# Patient Record
Sex: Male | Born: 1955 | Race: Asian | Hispanic: No | Marital: Married | Smoking: Never smoker
Health system: Southern US, Community
[De-identification: ages and names within clinical notes are randomized; demographics above are authoritative.]

## PROBLEM LIST (undated history)

## (undated) DIAGNOSIS — T7840XA Allergy, unspecified, initial encounter: Secondary | ICD-10-CM

## (undated) HISTORY — DX: Allergy, unspecified, initial encounter: T78.40XA

---

## 2015-09-02 ENCOUNTER — Ambulatory Visit (INDEPENDENT_AMBULATORY_CARE_PROVIDER_SITE_OTHER): Payer: BLUE CROSS/BLUE SHIELD | Admitting: Family Medicine

## 2015-09-02 ENCOUNTER — Ambulatory Visit (INDEPENDENT_AMBULATORY_CARE_PROVIDER_SITE_OTHER): Payer: BLUE CROSS/BLUE SHIELD

## 2015-09-02 VITALS — BP 114/72 | HR 69 | Temp 98.7°F | Resp 16 | Ht 65.0 in | Wt 129.4 lb

## 2015-09-02 DIAGNOSIS — Z114 Encounter for screening for human immunodeficiency virus [HIV]: Secondary | ICD-10-CM

## 2015-09-02 DIAGNOSIS — R351 Nocturia: Secondary | ICD-10-CM | POA: Diagnosis not present

## 2015-09-02 DIAGNOSIS — Z1159 Encounter for screening for other viral diseases: Secondary | ICD-10-CM

## 2015-09-02 DIAGNOSIS — Z13220 Encounter for screening for lipoid disorders: Secondary | ICD-10-CM | POA: Diagnosis not present

## 2015-09-02 DIAGNOSIS — Z131 Encounter for screening for diabetes mellitus: Secondary | ICD-10-CM

## 2015-09-02 DIAGNOSIS — R0602 Shortness of breath: Secondary | ICD-10-CM

## 2015-09-02 DIAGNOSIS — Z0000 Encounter for general adult medical examination without abnormal findings: Secondary | ICD-10-CM

## 2015-09-02 DIAGNOSIS — K219 Gastro-esophageal reflux disease without esophagitis: Secondary | ICD-10-CM

## 2015-09-02 DIAGNOSIS — N400 Enlarged prostate without lower urinary tract symptoms: Secondary | ICD-10-CM

## 2015-09-02 LAB — POC MICROSCOPIC URINALYSIS (UMFC): Mucus: ABSENT

## 2015-09-02 LAB — COMPREHENSIVE METABOLIC PANEL
ALT: 25 U/L (ref 9–46)
AST: 22 U/L (ref 10–35)
Albumin: 4.7 g/dL (ref 3.6–5.1)
Alkaline Phosphatase: 79 U/L (ref 40–115)
BILIRUBIN TOTAL: 0.7 mg/dL (ref 0.2–1.2)
BUN: 16 mg/dL (ref 7–25)
CO2: 26 mmol/L (ref 20–31)
CREATININE: 0.67 mg/dL — AB (ref 0.70–1.33)
Calcium: 9.6 mg/dL (ref 8.6–10.3)
Chloride: 104 mmol/L (ref 98–110)
GLUCOSE: 89 mg/dL (ref 65–99)
Potassium: 4.3 mmol/L (ref 3.5–5.3)
SODIUM: 139 mmol/L (ref 135–146)
Total Protein: 7.5 g/dL (ref 6.1–8.1)

## 2015-09-02 LAB — HEMOGLOBIN A1C
Hgb A1c MFr Bld: 5.4 % (ref <5.7)
Mean Plasma Glucose: 108 mg/dL

## 2015-09-02 LAB — POCT URINALYSIS DIP (MANUAL ENTRY)
BILIRUBIN UA: NEGATIVE
Bilirubin, UA: NEGATIVE
GLUCOSE UA: NEGATIVE
LEUKOCYTES UA: NEGATIVE
NITRITE UA: NEGATIVE
PH UA: 5.5
Protein Ur, POC: NEGATIVE
RBC UA: NEGATIVE
Spec Grav, UA: 1.025
Urobilinogen, UA: 0.2

## 2015-09-02 LAB — LIPID PANEL
CHOL/HDL RATIO: 3.3 ratio (ref <=5.0)
Cholesterol: 160 mg/dL (ref 125–200)
HDL: 49 mg/dL (ref >=40)
LDL Cholesterol: 96 mg/dL (ref <130)
Triglycerides: 75 mg/dL (ref <150)
VLDL: 15 mg/dL (ref <30)

## 2015-09-02 LAB — POCT CBC
GRANULOCYTE PERCENT: 64.7 % (ref 37–80)
HEMATOCRIT: 39.1 % — AB (ref 43.5–53.7)
HEMOGLOBIN: 13.8 g/dL — AB (ref 14.1–18.1)
LYMPH, POC: 2.4 (ref 0.6–3.4)
MCH, POC: 33.1 pg — AB (ref 27–31.2)
MCHC: 35.2 g/dL (ref 31.8–35.4)
MCV: 94 fL (ref 80–97)
MID (CBC): 0.4 (ref 0–0.9)
MPV: 7 fL (ref 0–99.8)
POC GRANULOCYTE: 5.1 (ref 2–6.9)
POC LYMPH PERCENT: 30.1 %L (ref 10–50)
POC MID %: 5.2 % (ref 0–12)
Platelet Count, POC: 258 10*3/uL (ref 142–424)
RBC: 4.16 M/uL — AB (ref 4.69–6.13)
RDW, POC: 13 %
WBC: 7.9 10*3/uL (ref 4.6–10.2)

## 2015-09-02 LAB — TSH: TSH: 2.09 m[IU]/L (ref 0.40–4.50)

## 2015-09-02 MED ORDER — OMEPRAZOLE 20 MG PO CPDR: 20.0000 mg | DELAYED_RELEASE_CAPSULE | Freq: Every day | ORAL | Status: DC

## 2015-09-02 NOTE — Patient Instructions (Addendum)
   IF you received an x-ray today, you will receive an invoice from [facility]. Please contact  Radiology at [phone] with questions or concerns regarding your invoice.   IF you received labwork today, you will receive an invoice from [facility]. Please contact [facility] at [phone] with questions or concerns regarding your invoice.   Our billing staff will not be able to assist you with questions regarding bills from these companies.  You will be contacted with the lab results as soon as they are available. The fastest way to get your results is to activate your My Chart account. Instructions are located on the last page of this paperwork. If you have not heard from us regarding the results in 2 weeks, please contact this office.    Keeping you healthy  Get these tests  Blood pressure- Have your blood pressure checked once a year by your healthcare provider.  Normal blood pressure is 120/80  Weight- Have your body mass index (BMI) calculated to screen for obesity.  BMI is a measure of body fat based on height and weight. You can also calculate your own BMI at www.nhl[facility].com/bmi/.  Cholesterol- Have your cholesterol checked every year.  Diabetes- Have your blood sugar checked regularly if you have high blood pressure, high cholesterol, have a family history of diabetes or if you are overweight.  Screening for Colon Cancer- Colonoscopy starting at age 50.  Screening may begin sooner depending on your family history and other health conditions. Follow up colonoscopy as directed by your Gastroenterologist.  Screening for Prostate Cancer- Both blood work (PSA) and a rectal exam help screen for Prostate Cancer.  Screening begins at age 40 with African-American men and at age 50 with Caucasian men.  Screening may begin sooner depending on your family history.  Take these medicines  Aspirin- One aspirin daily can help prevent Heart  disease and Stroke.  Flu shot- Every fall.  Tetanus- Every 10 years.  Zostavax- Once after the age of 60 to prevent Shingles.  Pneumonia shot- Once after the age of 65; if you are younger than 65, ask your healthcare provider if you need a Pneumonia shot.  Take these steps  Don't smoke- If you do smoke, talk to your doctor about quitting.  For tips on how to quit, go to www.smokefree.gov or call 1-800-QUIT-NOW.  Be physically active- Exercise 5 days a week for at least 30 minutes.  If you are not already physically active start slow and gradually work up to 30 minutes of moderate physical activity.  Examples of moderate activity include walking briskly, mowing the yard, dancing, swimming, bicycling, etc.  Eat a healthy diet- Eat a variety of healthy food such as fruits, vegetables, low fat milk, low fat cheese, yogurt, lean meant, poultry, fish, beans, tofu, etc. For more information go to www.thenutritionsource.org  Drink alcohol in moderation- Limit alcohol intake to less than two drinks a day. Never drink and drive.  Dentist- Brush and floss twice daily; visit your dentist twice a year.  Depression- Your emotional health is as important as your physical health. If you're feeling down, or losing interest in things you would normally enjoy please talk to your healthcare provider.  Eye exam- Visit your eye doctor every year.  Safe sex- If you may be exposed to a sexually transmitted infection, use a condom.  Seat belts- Seat belts can save your life; always wear one.  Smoke/Carbon Monoxide detectors- These detectors need to be installed on   the appropriate level of your home.  Replace batteries at least once a year.  Skin cancer- When out in the sun, cover up and use sunscreen 15 SPF or higher.  Violence- If anyone is threatening you, please tell your healthcare provider.  Living Will/ Health care power of attorney- Speak with your healthcare provider and family. 

## 2015-09-02 NOTE — Progress Notes (Signed)
Subjective:    Patient ID: Keith Lucas, male    DOB: 14-Apr-1955, 60 y.o.   MRN: [id number]  09/02/2015  Annual Exam   HPI This 60 y.o. male presents for Complete Physical Examination.  Last physical:  never Colonoscopy:  never TDAP:  never Influenza:  never Eye exam:  never Dental exam:  never  SOB: intermittent episodes of SOB/hard to breathe.  Onset for two years.  Not daily.  Cannot get a deep breath.  No cough.  Non-exertional.  Must yawn to help get a deep breath.  Knows he is going to catch a cold. Does not occur weekly; usually occurs once per month on average; usually will catch a cold with symptoms.  Review of Systems  Constitutional: Negative for fever, chills, diaphoresis, activity change, appetite change, fatigue and unexpected weight change.  HENT: Negative for congestion, dental problem, drooling, ear discharge, ear pain, facial swelling, hearing loss, mouth sores, nosebleeds, postnasal drip, rhinorrhea, sinus pressure, sneezing, sore throat, tinnitus, trouble swallowing and voice change.   Eyes: Negative for photophobia, pain, discharge, redness, itching and visual disturbance.  Respiratory: Positive for shortness of breath. Negative for apnea, cough, choking, chest tightness, wheezing and stridor.   Cardiovascular: Negative for chest pain, palpitations and leg swelling.  Gastrointestinal: Negative for nausea, vomiting, abdominal pain, diarrhea, constipation and blood in stool.  Endocrine: Negative for cold intolerance, heat intolerance, polydipsia, polyphagia and polyuria.  Genitourinary: Negative for dysuria, urgency, frequency, hematuria, flank pain, decreased urine volume, discharge, penile swelling, scrotal swelling, enuresis, difficulty urinating, genital sores, penile pain and testicular pain.  Musculoskeletal: Negative for myalgias, back pain, joint swelling, arthralgias, gait problem, neck pain and neck stiffness.  Skin: Negative for color change, pallor, rash and  wound.  Allergic/Immunologic: Negative for environmental allergies, food allergies and immunocompromised state.  Neurological: Negative for dizziness, tremors, seizures, syncope, facial asymmetry, speech difficulty, weakness, light-headedness, numbness and headaches.  Hematological: Negative for adenopathy. Does not bruise/bleed easily.  Psychiatric/Behavioral: Negative for suicidal ideas, hallucinations, behavioral problems, confusion, sleep disturbance, self-injury, dysphoric mood, decreased concentration and agitation. The patient is not nervous/anxious and is not hyperactive.     Past Medical History:  Diagnosis Date  . Allergy    History reviewed. No pertinent surgical history. No Known Allergies Current Outpatient Prescriptions  Medication Sig Dispense Refill  . loratadine-pseudoephedrine (CLARITIN-D 24-HOUR) 10-240 MG 24 hr tablet Take 1 tablet by mouth daily.    Marland Kitchen. omeprazole (PRILOSEC) 20 MG capsule Take 1 capsule (20 mg total) by mouth daily. 30 capsule 5   No current facility-administered medications for this visit.    Social History   Social History  . Marital status: Married    Spouse name: N/A  . Number of children: N/A  . Years of education: N/A   Occupational History  . Not on file.   Social History Main Topics  . Smoking status: Never Smoker  . Smokeless tobacco: Not on file  . Alcohol use Not on file  . Drug use: Unknown  . Sexual activity: Yes   Other Topics Concern  . Not on file   Social History Narrative   Marital status: married; moved to [place] from [place] in 2006.      Children: 1 child; no grandchildren      Employment: works at [employer]; Sports administratorrestaurant owner      Tobacco: none      Alcohol: none      Exercise: none      Seatbelt: 100%   History  reviewed. No pertinent family history.     Objective:    BP 114/72   Pulse 69   Temp 98.7 F (37.1 C) (Oral)   Resp 16   Ht  (1.651 m)   Wt 129 lb 6.4 oz (58.7 kg)   SpO2 99%   BMI  21.53 kg/m  Physical Exam  Constitutional: He is oriented to person, place, and time. He appears well-developed and well-nourished. No distress.  HENT:  Head: Normocephalic and atraumatic.  Right Ear: External ear normal.  Left Ear: External ear normal.  Nose: Nose normal.  Mouth/Throat: Oropharynx is clear and moist.  Eyes: Conjunctivae and EOM are normal. Pupils are equal, round, and reactive to light.  Neck: Normal range of motion. Neck supple. Carotid bruit is not present. No thyromegaly present.  Cardiovascular: Normal rate, regular rhythm, normal heart sounds and intact distal pulses.  Exam reveals no gallop and no friction rub.   No murmur heard. Pulmonary/Chest: Effort normal and breath sounds normal. He has no wheezes. He has no rales.  Abdominal: Soft. Bowel sounds are normal. He exhibits no distension and no mass. There is no tenderness. There is no rebound and no guarding. Hernia confirmed negative in the right inguinal area and confirmed negative in the left inguinal area.  Genitourinary: Testes normal and penis normal.  Musculoskeletal:       Right shoulder: Normal.       Left shoulder: Normal.       Cervical back: Normal.  Lymphadenopathy:    He has no cervical adenopathy.       Right: No inguinal adenopathy present.       Left: No inguinal adenopathy present.  Neurological: He is alert and oriented to person, place, and time. He has normal reflexes. No cranial nerve deficit. He exhibits normal muscle tone. Coordination normal.  Skin: Skin is warm and dry. No rash noted. He is not diaphoretic.  Psychiatric: He has a normal mood and affect. His behavior is normal. Judgment and thought content normal.        Assessment & Plan:   1. Routine physical examination   2. Screening for diabetes mellitus   3. Screening, lipid   4. Need for hepatitis C screening test   5. Screening for HIV (human immunodeficiency virus)   6. SOB (shortness of breath)   7. Gastroesophageal  reflux disease without esophagitis   8. Nocturia   9. BPH (benign prostatic hyperplasia)    -anticipatory guidance provided. -obtain age appropriate screening labs. -obtain PSA due to nocturia and BPH symptoms. -obtain CXR and EKG due to intermittent SOB. -rx for Prilosec provided due to frequent GERD symptoms.  GERD may be contributing to intermittent SOB symptoms.    Orders Placed This Encounter  Procedures  . DG Chest 2 View    Standing Status:   Future    Number of Occurrences:   1    Standing Expiration Date:   09/01/2016    Order Specific Question:   Reason for Exam (SYMPTOM  OR DIAGNOSIS REQUIRED)    Answer:   SOB at rest; GERD; from [place]    Order Specific Question:   Preferred imaging location?    Answer:   External  . Comprehensive metabolic panel    Order Specific Question:   Has the patient fasted?    Answer:   Yes  . Hemoglobin A1c  . Lipid panel    Order Specific Question:   Has the patient fasted?  Answer:   Yes  . TSH  . HIV antibody  . PSA  . Hepatitis C antibody  . POCT urinalysis dipstick  . POCT Microscopic Urinalysis ([facility])  . POCT CBC  . EKG 12-Lead   Meds ordered this encounter  Medications  . loratadine-pseudoephedrine (CLARITIN-D 24-HOUR) 10-240 MG 24 hr tablet    Sig: Take 1 tablet by mouth daily.  Marland Kitchen. omeprazole (PRILOSEC) 20 MG capsule    Sig: Take 1 capsule (20 mg total) by mouth daily.    Dispense:  30 capsule    Refill:  5    Return in about 2 months (around 11/03/2015) for recheck breathing, SOB, prostate.    Kristi Paulita FujitaMartin Smith, M.D. [facility]  [place] [address] [place], [place]  [id number] [phone] phone [phone]) [phone] fax

## 2015-09-03 LAB — HEPATITIS C ANTIBODY: HCV Ab: NEGATIVE

## 2015-09-03 LAB — HIV ANTIBODY (ROUTINE TESTING W REFLEX): HIV: NONREACTIVE

## 2015-09-03 LAB — PSA: PSA: 1.04 ng/mL (ref <=4.00)

## 2015-09-19 ENCOUNTER — Telehealth: Payer: Self-pay | Admitting: *Deleted

## 2015-09-19 NOTE — Telephone Encounter (Signed)
Patient needs results for labs Dr. Katrinka BlazingSmith has gone over and everything is normal.

## 2015-10-23 ENCOUNTER — Encounter: Payer: Self-pay | Admitting: Family Medicine

## 2017-05-29 IMAGING — DX DG CHEST 2V
2 series · 2 of 2 positions shown · non-contrast
Comparison: No prior.

CLINICAL DATA: Shortness of breath.

EXAM:
CHEST  2 VIEW

[chest pa]
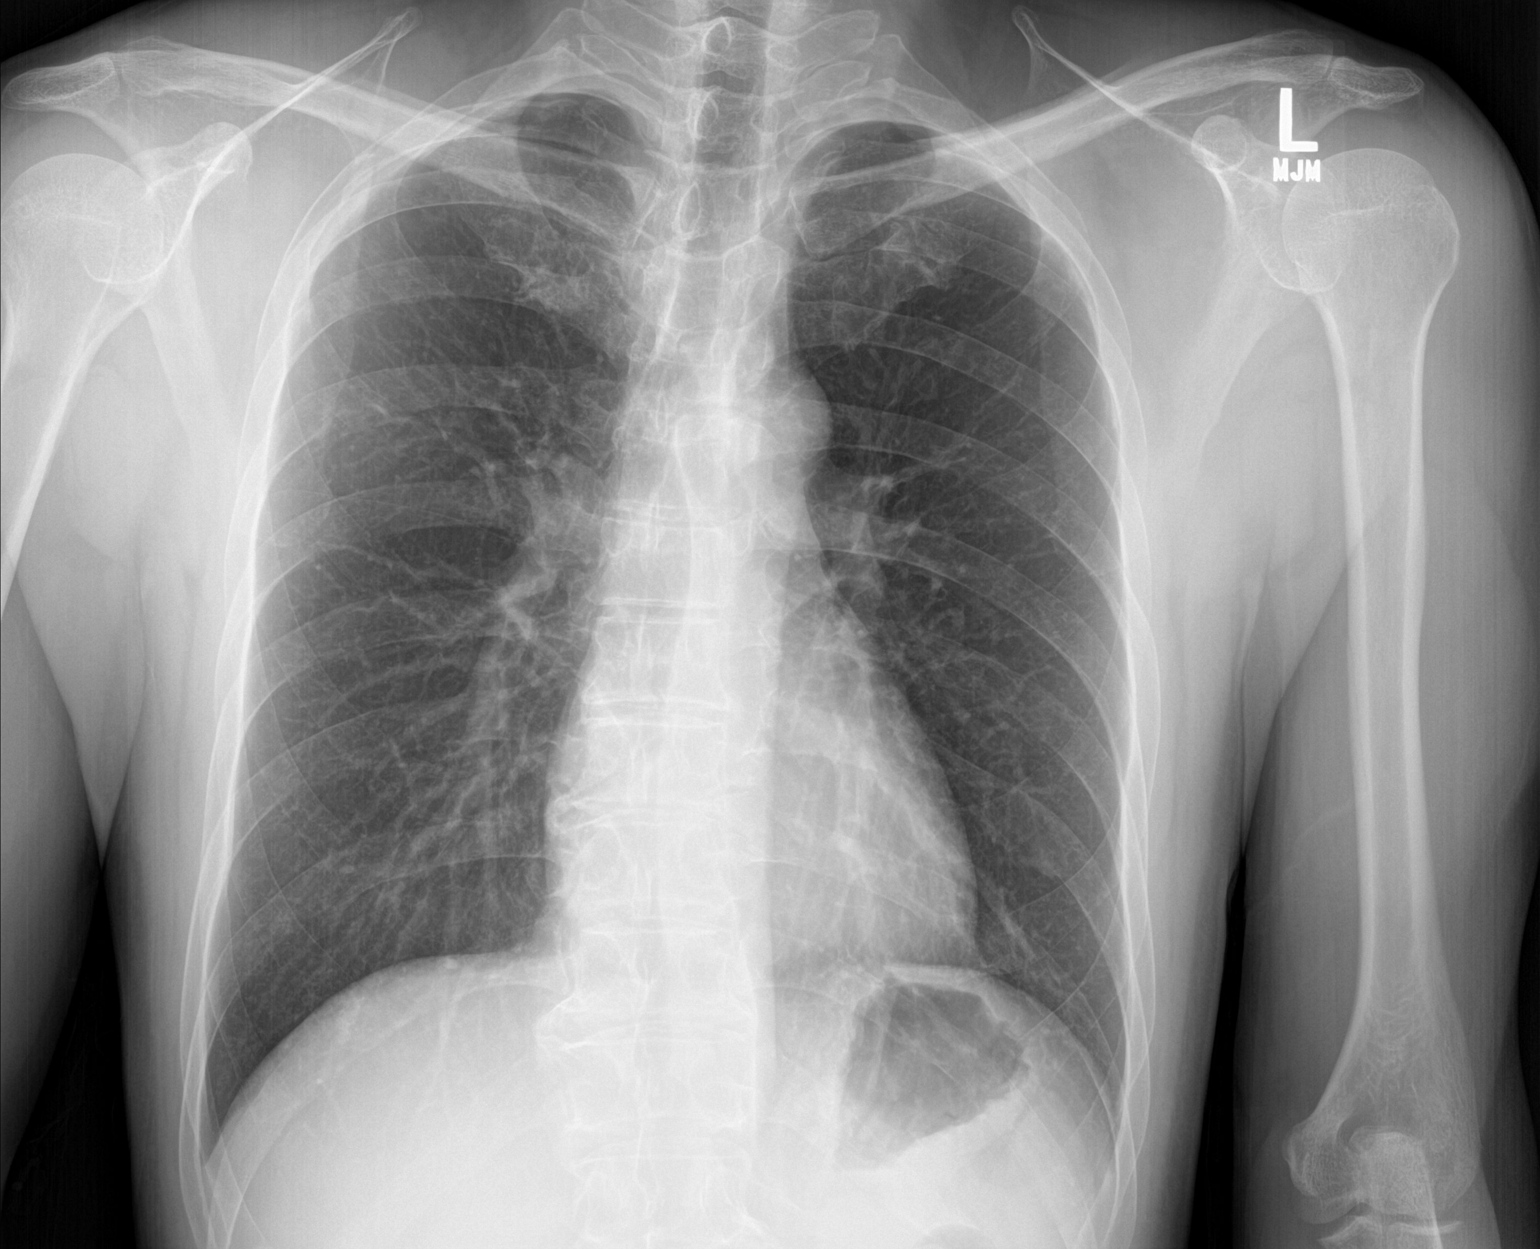

[chest lat]
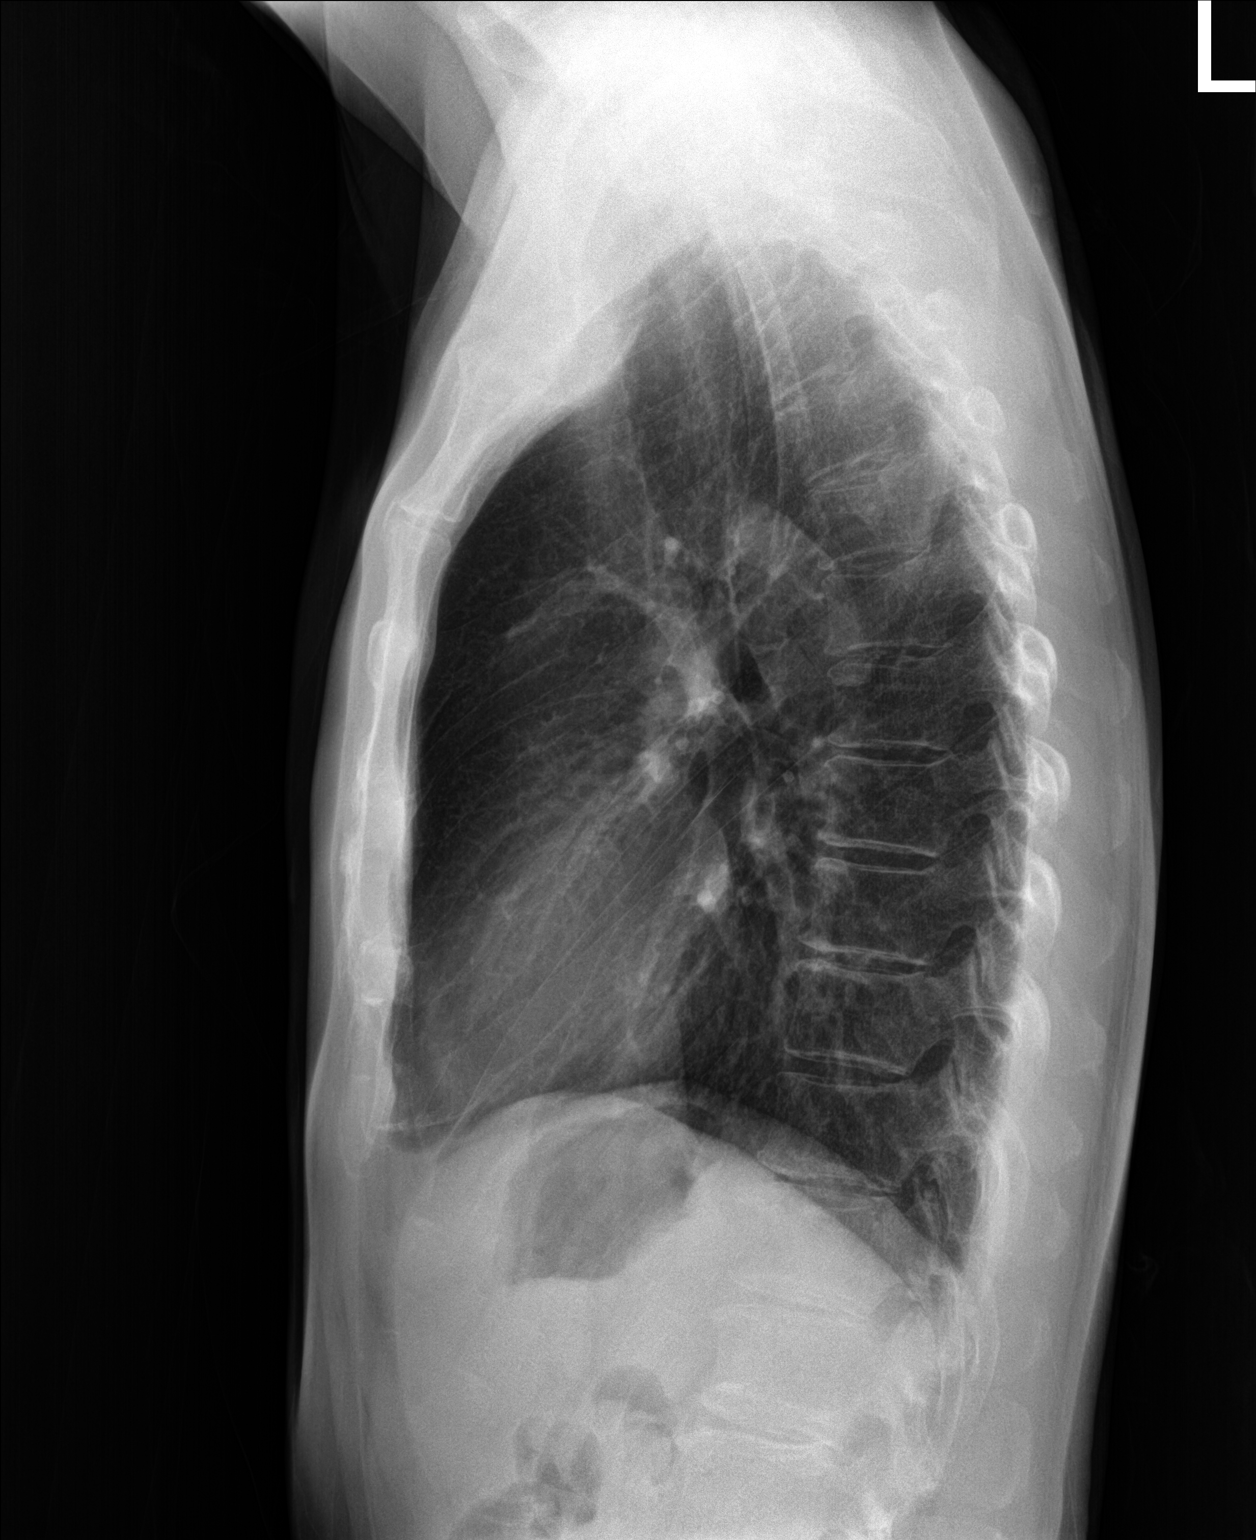

[2 of 2 positions shown; findings below may reference images not displayed]

FINDINGS: Mediastinum hilar structures normal. Lungs are clear. Heart size
normal. No pleural effusion pneumothorax. Degenerative changes
thoracic spine.
IMPRESSION: No acute abnormality.

## 2017-07-26 ENCOUNTER — Encounter: Payer: Self-pay | Admitting: Family Medicine

## 2019-05-11 ENCOUNTER — Emergency Department (HOSPITAL_COMMUNITY): Payer: 59

## 2019-05-11 ENCOUNTER — Other Ambulatory Visit: Payer: Self-pay

## 2019-05-11 ENCOUNTER — Encounter (HOSPITAL_COMMUNITY): Payer: Self-pay

## 2019-05-11 ENCOUNTER — Emergency Department (HOSPITAL_COMMUNITY)
Admission: EM | Admit: 2019-05-11 | Discharge: 2019-05-11 | Disposition: A | Discharge status: Home / Self Care | Payer: 59 | Attending: Emergency Medicine | Admitting: Emergency Medicine

## 2019-05-11 DIAGNOSIS — Z79899 Other long term (current) drug therapy: Secondary | ICD-10-CM | POA: Diagnosis not present

## 2019-05-11 DIAGNOSIS — R079 Chest pain, unspecified: Secondary | ICD-10-CM | POA: Insufficient documentation

## 2019-05-11 DIAGNOSIS — R0602 Shortness of breath: Secondary | ICD-10-CM | POA: Diagnosis present

## 2019-05-11 LAB — CBC
HCT: 41.9 % (ref 39.0–52.0)
Hemoglobin: 13.6 g/dL (ref 13.0–17.0)
MCH: 31.5 pg (ref 26.0–34.0)
MCHC: 32.5 g/dL (ref 30.0–36.0)
MCV: 97 fL (ref 80.0–100.0)
Platelets: 298 10*3/uL (ref 150–400)
RBC: 4.32 MIL/uL (ref 4.22–5.81)
RDW: 12.3 % (ref 11.5–15.5)
WBC: 7.5 10*3/uL (ref 4.0–10.5)
nRBC: 0 % (ref 0.0–0.2)

## 2019-05-11 LAB — BASIC METABOLIC PANEL
Anion gap: 9 (ref 5–15)
BUN: 12 mg/dL (ref 8–23)
CO2: 27 mmol/L (ref 22–32)
Calcium: 9 mg/dL (ref 8.9–10.3)
Chloride: 104 mmol/L (ref 98–111)
Creatinine, Ser: 0.68 mg/dL (ref 0.61–1.24)
GFR calc Af Amer: >60 mL/min (ref >60)
GFR calc non Af Amer: >60 mL/min (ref >60)
Glucose, Bld: 98 mg/dL (ref 70–99)
Potassium: 4 mmol/L (ref 3.5–5.1)
Sodium: 140 mmol/L (ref 135–145)

## 2019-05-11 LAB — TROPONIN I (HIGH SENSITIVITY)
Troponin I (High Sensitivity): 3 ng/L (ref <18)
Troponin I (High Sensitivity): <2 ng/L (ref <18)

## 2019-05-11 LAB — D-DIMER, QUANTITATIVE: D-Dimer, Quant: <0.27 ug/mL-FEU (ref 0.00–0.50)

## 2019-05-11 NOTE — ED Provider Notes (Signed)
Pt seen in conjunction with L Joldersma, PA-C.   In brief, pt presenting for increasing frequency of episodes of CP and SOB.  He is last for few seconds before resolving without intervention.  Not worse with exertion.  They are occurring daily.  No cardiac risk factors.  No PE risk factors.  Patient states he takes no medications daily, has no medical problems.  Denies tobacco use.  On exam, patient appears nontoxic.  No signs of respiratory distress.  Work-up overall reassuring.  Electrolytes stable.  Troponin normal.  Chest x-ray without abnormalities.  D-dimer pending.  Patient signed out to oncoming team for follow-up on dimer and plan for discharge.    Alveria Apley, PA-C 05/11/19 1521    Tegeler, Canary Brim, MD 05/11/19 [phone]

## 2019-05-11 NOTE — ED Notes (Signed)
Patient verbalizes understanding of discharge instructions. Opportunity for questioning and answers were provided. Armband removed by staff, pt discharged from ED.  

## 2019-05-11 NOTE — ED Provider Notes (Signed)
  Physical Exam  BP (!) 142/73   Pulse 72   Temp 97.9 F (36.6 C) (Oral)   Resp 18   Ht 5' 4.96" (1.65 m)   Wt 52 kg   SpO2 97%   BMI 19.10 kg/m   Physical Exam Vitals and nursing note reviewed.  Constitutional:      Appearance: He is well-developed.  HENT:     Head: Normocephalic and atraumatic.  Eyes:     Conjunctiva/sclera: Conjunctivae normal.  Cardiovascular:     Rate and Rhythm: Normal rate and regular rhythm.     Pulses: Normal pulses.  Pulmonary:     Effort: Pulmonary effort is normal. No respiratory distress.     Breath sounds: Normal breath sounds.  Abdominal:     Palpations: Abdomen is soft.     Tenderness: There is no abdominal tenderness.  Musculoskeletal:     Cervical back: Neck supple.  Skin:    General: Skin is warm and dry.  Neurological:     Mental Status: He is alert.     ED Course/Procedures     Procedures  MDM  Assumed care from offgoing provider at 1500. In brief, pt presenting to ED today due to SOB and dull L sided chest pain. CBC and CMP unremarkable. Serial trop 3 and <2, consecutively. EKG with NSR and no signs of acute ischemia. Ddimer collected by offgoing team. Plan at handoff is to f/u Ddimer. If negative, pt to be discharged home for outpt f/u with PCP.  Ddimer <0.27. No need for CTA. No need for further w/u or intervention while in the ED.   Pt discharged home. Discussed results with pt and he is in agreement with plan. Provided strict return precautions. Encouraged pt to f/u with PCP in 2-3 days for reassessment. Pt stable at time of discharge.   Pt assessed and evaluated with Dr. Adela Lank.  Delray Alt, MD       Delray Alt, MD 05/12/19 1478    Melene Plan, DO 05/12/19 2956

## 2019-05-11 NOTE — ED Provider Notes (Signed)
[facility] EMERGENCY DEPARTMENT Provider Note   CSN: [id number] Arrival date & time: 05/11/19  1109     History Chief Complaint  Patient presents with  . Shortness of Breath  . Chest Pain    Keith Lucas is a 64 y.o. male.  The history is provided by the patient. A language interpreter was used.   HPI Comments: Keith Lucas is a 64 y.o. male with no known medical history who presents to the Emergency Department complaining of shortness of breath for the past 2 weeks.  Patient states for the past 2 weeks he has been experiencing an intermittent feeling of shortness of breath which lasts for about 8 to 10 minutes and occurs multiple times throughout the day and are increasing in frequency.  He denies any known modifying factors.  He reports an associated dull ache in the left chest when his symptoms worsen.  He denies any current congestion or URI symptoms but when asked regarding his current medications he notes that he has been taking OTC decongestants.  He denies taking any other regular medications.  He feels as though "he is getting sick" but denies any recent illnesses.  He is not a smoker.  He denies any known medical history.  He denies fevers, chills, abdominal pain, nausea, vomiting, diarrhea, acute urinary changes, lightheadedness, syncope.     Past Medical History:  Diagnosis Date  . Allergy     There are no problems to display for this patient.   History reviewed. No pertinent surgical history.    No family history on file.  Social History   Tobacco Use  . Smoking status: Never Smoker  Substance Use Topics  . Alcohol use: Not on file  . Drug use: Not on file    Home Medications Prior to Admission medications   Medication Sig Start Date End Date Taking? Authorizing Provider  loratadine-pseudoephedrine (CLARITIN-D 24-HOUR) 10-240 MG 24 hr tablet Take 1 tablet by mouth daily.    [provider]  omeprazole (PRILOSEC) 20 MG capsule Take 1  capsule (20 mg total) by mouth daily. 09/02/15   Wardell Honour, MD    Allergies    Patient has no known allergies.  Review of Systems   Review of Systems  Constitutional: Positive for fatigue. Negative for chills and fever.  HENT: Negative for congestion, rhinorrhea and sneezing.   Respiratory: Positive for shortness of breath. Negative for wheezing.   Cardiovascular: Positive for chest pain. Negative for leg swelling.  Gastrointestinal: Negative for abdominal pain, diarrhea, nausea and vomiting.  Genitourinary: Negative for dysuria and hematuria.  All other systems reviewed and are negative.  Physical Exam Updated Vital Signs BP 112/74 (BP Location: Right Arm)   Pulse 76   Temp 97.9 F (36.6 C) (Oral)   Resp 15   Ht 5' 4.96" (1.65 m)   Wt 52 kg   SpO2 100%   BMI 19.10 kg/m   Physical Exam Vitals and nursing note reviewed.  Constitutional:      General: He is not in acute distress.    Appearance: He is well-developed and normal weight. He is not ill-appearing, toxic-appearing or diaphoretic.     Comments: Well-developed adult male.  He sits upright and speaks clearly and coherently.  HENT:     Head: Normocephalic and atraumatic.     Mouth/Throat:     Mouth: Mucous membranes are moist.     Pharynx: No pharyngeal swelling or oropharyngeal exudate.  Eyes:  Extraocular Movements: Extraocular movements intact.     Pupils: Pupils are equal, round, and reactive to light.  Chest:     Chest wall: Tenderness present. No deformity or crepitus.     Comments: Mild TTP noted on the left anterior chest wall.   Abdominal:     Palpations: Abdomen is soft. There is no mass.     Tenderness: There is no abdominal tenderness. There is no guarding or rebound.  Musculoskeletal:        General: Normal range of motion.     Cervical back: Normal range of motion and neck supple.     Right lower leg: No tenderness. No edema.     Left lower leg: No tenderness. No edema.  Neurological:      Mental Status: He is alert.    ED Results / Procedures / Treatments   Labs (all labs ordered are listed, but only abnormal results are displayed) Labs Reviewed  BASIC METABOLIC PANEL  CBC  TROPONIN I (HIGH SENSITIVITY)  TROPONIN I (HIGH SENSITIVITY)   EKG EKG Interpretation  Date/Time:  Thursday May 11 2019 11:14:25 EST Ventricular Rate:  80 PR Interval:  152 QRS Duration: 68 QT Interval:  386 QTC Calculation: 445 R Axis:   86 Text Interpretation: Normal sinus rhythm Normal ECG No prior ECG for comparison. No STEMI Confirmed by Theda Belfast ([id number]) on 05/11/2019 12:20:49 PM  Radiology DG Chest Portable 1 View  Result Date: 05/11/2019 CLINICAL DATA:  Chest pain today, chest tightness, shortness of breath. EXAM: PORTABLE CHEST 1 VIEW COMPARISON:  Chest x-ray dated 09/02/2015. FINDINGS: The heart size and mediastinal contours are within normal limits. Both lungs are clear. The visualized skeletal structures are unremarkable. IMPRESSION: No active disease.  No evidence of pneumonia or pulmonary edema. Electronically Signed   By: Bary Richard M.D.   On: 05/11/2019 12:43   Procedures Procedures (including critical care time)  Medications Ordered in ED Medications - No data to display  ED Course  I have reviewed the triage vital signs and the nursing notes.  Pertinent labs & imaging results that were available during my care of the patient were reviewed by me and considered in my medical decision making (see chart for details).    MDM Rules/Calculators/A&P                      1:11 PM patient is a 64 year old male presents with intermittent shortness of breath and dull aching left-sided chest pain.  Chest x-ray, ECG and initial labs are reassuring.  Not tachycardic.  Nonconcerning O2 saturations.  Initial troponin is nonelevated.  Physical exam is reassuring.  Will wait for delta troponin.  Discussed his results with the patient via a Mandarin interpreter.  Discussed his  risk for possible DVT/PE based on his symptoms though my suspicion is low.  Patient states he is amenable with moving forward with a D-dimer.  He understood that if this is elevated we will need to perform a CTA.  Discussed this with my attending physician Dr. Cristal Deer Tegeler as well as Alveria Apley PA-C.  They agree with the above plan.  Will reassess.  3:01 PM pt discussed with Dr. Delray Alt who will be taking over care of pt.    Final Clinical Impression(s) / ED Diagnoses Final diagnoses:  None    Rx / DC Orders ED Discharge Orders    None       Placido Sou, PA-C 05/11/19 1509    Tegeler,  Canary Brim, MD 05/11/19 1526

## 2019-05-11 NOTE — ED Triage Notes (Signed)
Mandarin interpreter used for triage:   Pt reports Sob and chest tightness for the past 2 weeks. resp e.u at this time.

## 2021-02-04 IMAGING — DX DG CHEST 1V PORT
1 series · 1 of 1 positions shown · non-contrast
Comparison: Chest x-ray dated 09/02/2015.

CLINICAL DATA: Chest pain today, chest tightness, shortness of
breath.

EXAM:
PORTABLE CHEST 1 VIEW

[chest ap]
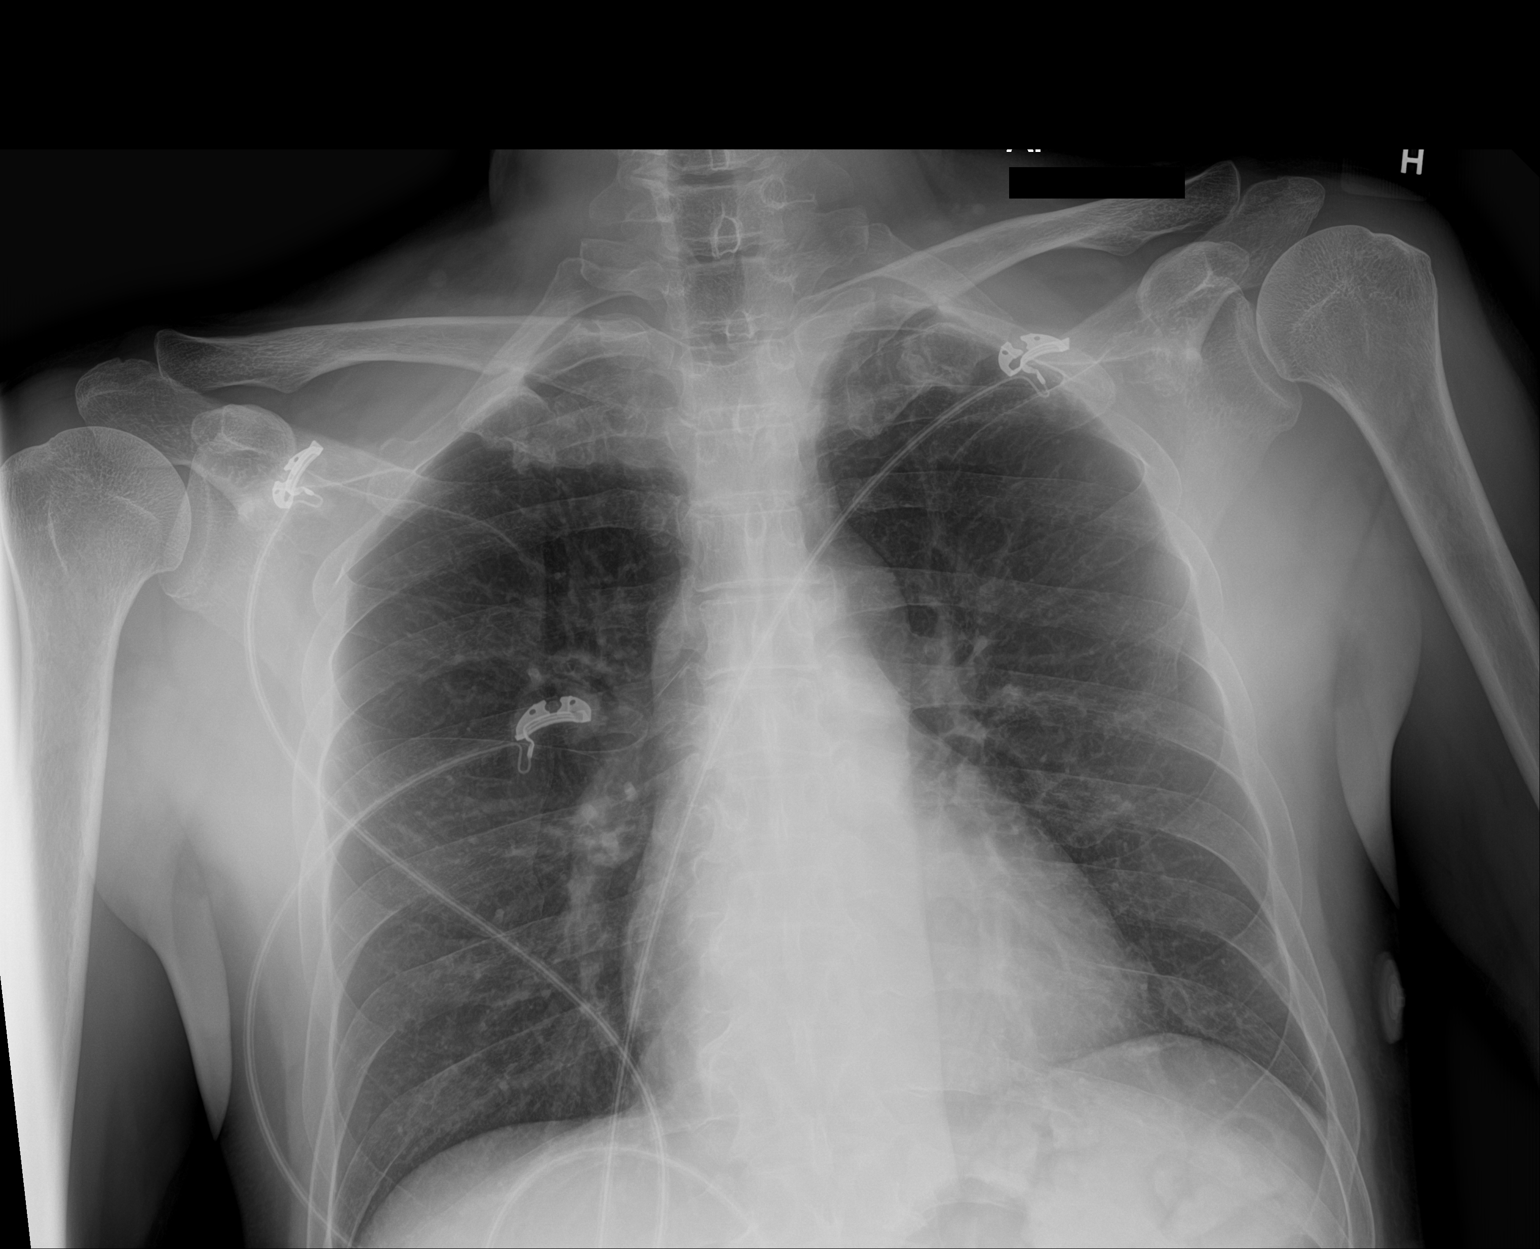

[1 of 1 positions shown; findings below may reference images not displayed]

FINDINGS: The heart size and mediastinal contours are within normal limits.
Both lungs are clear. The visualized skeletal structures are
unremarkable.
IMPRESSION: No active disease.  No evidence of pneumonia or pulmonary edema.

## 2023-02-07 ENCOUNTER — Emergency Department (HOSPITAL_COMMUNITY): Payer: Medicare PPO

## 2023-02-07 ENCOUNTER — Emergency Department (HOSPITAL_COMMUNITY)
Admission: EM | Admit: 2023-02-07 | Discharge: 2023-02-07 | Disposition: A | Discharge status: Home / Self Care | Payer: Medicare PPO | Attending: Emergency Medicine | Admitting: Emergency Medicine

## 2023-02-07 ENCOUNTER — Other Ambulatory Visit: Payer: Self-pay

## 2023-02-07 DIAGNOSIS — J101 Influenza due to other identified influenza virus with other respiratory manifestations: Secondary | ICD-10-CM | POA: Insufficient documentation

## 2023-02-07 DIAGNOSIS — Z20822 Contact with and (suspected) exposure to covid-19: Secondary | ICD-10-CM | POA: Diagnosis not present

## 2023-02-07 DIAGNOSIS — R112 Nausea with vomiting, unspecified: Secondary | ICD-10-CM | POA: Diagnosis present

## 2023-02-07 LAB — CBC
HCT: 34.6 % — ABNORMAL LOW (ref 39.0–52.0)
Hemoglobin: 12 g/dL — ABNORMAL LOW (ref 13.0–17.0)
MCH: 32.5 pg (ref 26.0–34.0)
MCHC: 34.7 g/dL (ref 30.0–36.0)
MCV: 93.8 fL (ref 80.0–100.0)
Platelets: 203 10*3/uL (ref 150–400)
RBC: 3.69 MIL/uL — ABNORMAL LOW (ref 4.22–5.81)
RDW: 12.8 % (ref 11.5–15.5)
WBC: 7.3 10*3/uL (ref 4.0–10.5)
nRBC: 0 % (ref 0.0–0.2)

## 2023-02-07 LAB — URINALYSIS, ROUTINE W REFLEX MICROSCOPIC
Bacteria, UA: NONE SEEN
Bilirubin Urine: NEGATIVE
Glucose, UA: NEGATIVE mg/dL
Ketones, ur: 20 mg/dL — AB
Leukocytes,Ua: NEGATIVE
Nitrite: NEGATIVE
Protein, ur: NEGATIVE mg/dL
Specific Gravity, Urine: 1.014 (ref 1.005–1.030)
pH: 5 (ref 5.0–8.0)

## 2023-02-07 LAB — COMPREHENSIVE METABOLIC PANEL
ALT: 26 U/L (ref 0–44)
AST: 29 U/L (ref 15–41)
Albumin: 3.7 g/dL (ref 3.5–5.0)
Alkaline Phosphatase: 86 U/L (ref 38–126)
Anion gap: 10 (ref 5–15)
BUN: 14 mg/dL (ref 8–23)
CO2: 24 mmol/L (ref 22–32)
Calcium: 8.4 mg/dL — ABNORMAL LOW (ref 8.9–10.3)
Chloride: 97 mmol/L — ABNORMAL LOW (ref 98–111)
Creatinine, Ser: 0.72 mg/dL (ref 0.61–1.24)
GFR, Estimated: >60 mL/min (ref >60)
Glucose, Bld: 97 mg/dL (ref 70–99)
Potassium: 3.4 mmol/L — ABNORMAL LOW (ref 3.5–5.1)
Sodium: 131 mmol/L — ABNORMAL LOW (ref 135–145)
Total Bilirubin: 0.7 mg/dL (ref <1.2)
Total Protein: 7.2 g/dL (ref 6.5–8.1)

## 2023-02-07 LAB — RESP PANEL BY RT-PCR (RSV, FLU A&B, COVID)  RVPGX2
Influenza A by PCR: POSITIVE — AB
Influenza B by PCR: NEGATIVE
Resp Syncytial Virus by PCR: NEGATIVE
SARS Coronavirus 2 by RT PCR: NEGATIVE

## 2023-02-07 LAB — LIPASE, BLOOD: Lipase: 32 U/L (ref 11–51)

## 2023-02-07 MED ORDER — SODIUM CHLORIDE 0.9 % IV BOLUS
1000.0000 mL | Freq: Once | INTRAVENOUS | Status: AC
  Administered 2023-02-07: 1000 mL via INTRAVENOUS

## 2023-02-07 MED ORDER — ONDANSETRON HCL 4 MG/2ML IJ SOLN
4.0000 mg | Freq: Once | INTRAMUSCULAR | Status: AC | PRN
  Administered 2023-02-07: 4 mg via INTRAVENOUS
  Filled 2023-02-07: qty 2

## 2023-02-07 MED ORDER — ACETAMINOPHEN 325 MG PO TABS
650.0000 mg | ORAL_TABLET | Freq: Once | ORAL | Status: AC
  Administered 2023-02-07: 650 mg via ORAL
  Filled 2023-02-07: qty 2

## 2023-02-07 MED ORDER — KETOROLAC TROMETHAMINE 30 MG/ML IJ SOLN
15.0000 mg | Freq: Once | INTRAMUSCULAR | Status: AC
  Administered 2023-02-07: 15 mg via INTRAVENOUS
  Filled 2023-02-07: qty 1

## 2023-02-07 MED ORDER — SODIUM CHLORIDE 0.9 % IV SOLN: INTRAVENOUS | Status: DC

## 2023-02-07 NOTE — ED Provider Notes (Signed)
[place] EMERGENCY DEPARTMENT AT [facility] Provider Note   CSN: [id number] Arrival date & time: 02/07/23  1808     History  Chief Complaint  Patient presents with   Fever   Nausea   Headache    Keith Lucas is a 67 y.o. male.  67 year old male presents with myalgias URI symptoms x 2 days.  Patient works as a Investment banker, operational.  Has had some nausea and vomiting without abdominal discomfort.  Has had a mild headache without neck pain or photophobia.  No confusion noted.  Slight cough noted.  Denies any polyuria or dysuria.  No current emesis.  No diarrhea.  Denies any other past medical history.  No recent travel history.  No treatment use prior to arrival       Home Medications Prior to Admission medications   Medication Sig Start Date End Date Taking? Authorizing Provider  loratadine-pseudoephedrine (CLARITIN-D 24-HOUR) 10-240 MG 24 hr tablet Take 1 tablet by mouth as needed for allergies.     [provider]  omeprazole (PRILOSEC) 20 MG capsule Take 1 capsule (20 mg total) by mouth daily. Patient not taking: Reported on 05/11/2019 09/02/15   Ethelda Chick, MD      Allergies    Patient has no known allergies.    Review of Systems   Review of Systems  All other systems reviewed and are negative.   Physical Exam Updated Vital Signs BP (!) 108/52 (BP Location: Right Arm)   Pulse (!) 102   Temp 100.3 F (37.9 C) (Oral)   Resp 18   Ht 1.651 m (5\' 5" )   Wt 51.7 kg   SpO2 96%   BMI 18.97 kg/m  Physical Exam Vitals and nursing note reviewed.  Constitutional:      General: He is not in acute distress.    Appearance: Normal appearance. He is well-developed. He is not toxic-appearing.  HENT:     Head: Normocephalic and atraumatic.  Eyes:     General: Lids are normal.     Conjunctiva/sclera: Conjunctivae normal.     Pupils: Pupils are equal, round, and reactive to light.  Neck:     Thyroid: No thyroid mass.     Trachea: No tracheal deviation.   Cardiovascular:     Rate and Rhythm: Normal rate and regular rhythm.     Heart sounds: Normal heart sounds. No murmur heard.    No gallop.  Pulmonary:     Effort: Pulmonary effort is normal. No respiratory distress.     Breath sounds: Normal breath sounds. No stridor. No decreased breath sounds, wheezing, rhonchi or rales.  Abdominal:     General: There is no distension.     Palpations: Abdomen is soft.     Tenderness: There is no abdominal tenderness. There is no rebound.  Musculoskeletal:        General: No tenderness. Normal range of motion.     Cervical back: Normal range of motion and neck supple.  Skin:    General: Skin is warm and dry.     Findings: No abrasion or rash.  Neurological:     Mental Status: He is alert and oriented to person, place, and time. Mental status is at baseline.     GCS: GCS eye subscore is 4. GCS verbal subscore is 5. GCS motor subscore is 6.     Cranial Nerves: Cranial nerves are intact. No cranial nerve deficit.     Sensory: No sensory deficit.  Motor: Motor function is intact.  Psychiatric:        Attention and Perception: Attention normal.        Speech: Speech normal.        Behavior: Behavior normal.     ED Results / Procedures / Treatments   Labs (all labs ordered are listed, but only abnormal results are displayed) Labs Reviewed  COMPREHENSIVE METABOLIC PANEL - Abnormal; Notable for the following components:      Result Value   Sodium 131 (*)    Potassium 3.4 (*)    Chloride 97 (*)    Calcium 8.4 (*)    All other components within normal limits  CBC - Abnormal; Notable for the following components:   RBC 3.69 (*)    Hemoglobin 12.0 (*)    HCT 34.6 (*)    All other components within normal limits  URINALYSIS, ROUTINE W REFLEX MICROSCOPIC - Abnormal; Notable for the following components:   Hgb urine dipstick MODERATE (*)    Ketones, ur 20 (*)    All other components within normal limits  RESP PANEL BY RT-PCR (RSV, FLU A&B,  COVID)  RVPGX2  LIPASE, BLOOD    EKG None  Radiology No results found.  Procedures Procedures    Medications Ordered in ED Medications  ondansetron (ZOFRAN) injection 4 mg (has no administration in time range)  0.9 %  sodium chloride infusion ( Intravenous New Bag/Given 02/07/23 1956)  ketorolac (TORADOL) 30 MG/ML injection 15 mg (has no administration in time range)  acetaminophen (TYLENOL) tablet 650 mg (has no administration in time range)  sodium chloride 0.9 % bolus 1,000 mL (1,000 mLs Intravenous New Bag/Given 02/07/23 1958)    ED Course/ Medical Decision Making/ A&P                                 Medical Decision Making Amount and/or Complexity of Data Reviewed Labs: ordered. Radiology: ordered.  Risk OTC drugs. Prescription drug management.   Patient given IV fluids here.  Suspect possible viral illness.  Confirmed by patient being influenza A positive.  Chest x-ray without evidence of pneumonia.  Urinalysis negative for infection.  Patient feels better after IV hydration and Toradol and meds.  Will discharge home interpreter used for this encounter        Final Clinical Impression(s) / ED Diagnoses Final diagnoses:  None    Rx / DC Orders ED Discharge Orders     None         Lorre Nick, MD 02/07/23 2108

## 2023-02-07 NOTE — ED Triage Notes (Signed)
Pt arrived via POV. C/o HA, nausea, and fever of 102.7F for 2x days. Home meds have not helped.  AOx4

## 2023-02-09 ENCOUNTER — Emergency Department (HOSPITAL_COMMUNITY): Payer: Medicare PPO

## 2023-02-09 ENCOUNTER — Observation Stay (HOSPITAL_COMMUNITY)
Admission: EM | Admit: 2023-02-09 | Discharge: 2023-02-10 | Discharge status: Against Medical Advice | Payer: Medicare PPO | Attending: Family Medicine | Admitting: Family Medicine

## 2023-02-09 ENCOUNTER — Encounter (HOSPITAL_COMMUNITY): Payer: Self-pay

## 2023-02-09 ENCOUNTER — Other Ambulatory Visit: Payer: Self-pay

## 2023-02-09 DIAGNOSIS — R112 Nausea with vomiting, unspecified: Secondary | ICD-10-CM | POA: Diagnosis not present

## 2023-02-09 DIAGNOSIS — D72819 Decreased white blood cell count, unspecified: Secondary | ICD-10-CM | POA: Diagnosis not present

## 2023-02-09 DIAGNOSIS — E876 Hypokalemia: Secondary | ICD-10-CM | POA: Diagnosis not present

## 2023-02-09 DIAGNOSIS — K8020 Calculus of gallbladder without cholecystitis without obstruction: Secondary | ICD-10-CM | POA: Diagnosis not present

## 2023-02-09 DIAGNOSIS — R1010 Upper abdominal pain, unspecified: Secondary | ICD-10-CM | POA: Diagnosis present

## 2023-02-09 DIAGNOSIS — K2970 Gastritis, unspecified, without bleeding: Secondary | ICD-10-CM | POA: Insufficient documentation

## 2023-02-09 DIAGNOSIS — J101 Influenza due to other identified influenza virus with other respiratory manifestations: Secondary | ICD-10-CM | POA: Diagnosis not present

## 2023-02-09 LAB — COMPREHENSIVE METABOLIC PANEL
ALT: 23 U/L (ref 0–44)
AST: 28 U/L (ref 15–41)
Albumin: 3.4 g/dL — ABNORMAL LOW (ref 3.5–5.0)
Alkaline Phosphatase: 75 U/L (ref 38–126)
Anion gap: 9 (ref 5–15)
BUN: 10 mg/dL (ref 8–23)
CO2: 30 mmol/L (ref 22–32)
Calcium: 8.3 mg/dL — ABNORMAL LOW (ref 8.9–10.3)
Chloride: 97 mmol/L — ABNORMAL LOW (ref 98–111)
Creatinine, Ser: 0.55 mg/dL — ABNORMAL LOW (ref 0.61–1.24)
GFR, Estimated: >60 mL/min (ref >60)
Glucose, Bld: 103 mg/dL — ABNORMAL HIGH (ref 70–99)
Potassium: 3.2 mmol/L — ABNORMAL LOW (ref 3.5–5.1)
Sodium: 136 mmol/L (ref 135–145)
Total Bilirubin: 0.5 mg/dL (ref <1.2)
Total Protein: 6.9 g/dL (ref 6.5–8.1)

## 2023-02-09 LAB — CBC
HCT: 34.3 % — ABNORMAL LOW (ref 39.0–52.0)
Hemoglobin: 11.8 g/dL — ABNORMAL LOW (ref 13.0–17.0)
MCH: 32.2 pg (ref 26.0–34.0)
MCHC: 34.4 g/dL (ref 30.0–36.0)
MCV: 93.5 fL (ref 80.0–100.0)
Platelets: 178 10*3/uL (ref 150–400)
RBC: 3.67 MIL/uL — ABNORMAL LOW (ref 4.22–5.81)
RDW: 12.7 % (ref 11.5–15.5)
WBC: 3.5 10*3/uL — ABNORMAL LOW (ref 4.0–10.5)
nRBC: 0 % (ref 0.0–0.2)

## 2023-02-09 LAB — URINALYSIS, ROUTINE W REFLEX MICROSCOPIC
Bilirubin Urine: NEGATIVE
Glucose, UA: NEGATIVE mg/dL
Hgb urine dipstick: NEGATIVE
Ketones, ur: 20 mg/dL — AB
Leukocytes,Ua: NEGATIVE
Nitrite: NEGATIVE
Protein, ur: 30 mg/dL — AB
Specific Gravity, Urine: 1.014 (ref 1.005–1.030)
pH: 9 — ABNORMAL HIGH (ref 5.0–8.0)

## 2023-02-09 LAB — LIPASE, BLOOD: Lipase: 37 U/L (ref 11–51)

## 2023-02-09 MED ORDER — POTASSIUM CHLORIDE CRYS ER 20 MEQ PO TBCR
40.0000 meq | EXTENDED_RELEASE_TABLET | Freq: Once | ORAL | Status: AC
  Administered 2023-02-09: 40 meq via ORAL
  Filled 2023-02-09: qty 2

## 2023-02-09 MED ORDER — ONDANSETRON HCL 4 MG/2ML IJ SOLN
4.0000 mg | Freq: Once | INTRAMUSCULAR | Status: AC
  Administered 2023-02-09: 4 mg via INTRAVENOUS
  Filled 2023-02-09: qty 2

## 2023-02-09 MED ORDER — ACETAMINOPHEN 650 MG RE SUPP: 650.0000 mg | Freq: Four times a day (QID) | RECTAL | Status: DC | PRN

## 2023-02-09 MED ORDER — IOHEXOL 300 MG/ML  SOLN
100.0000 mL | Freq: Once | INTRAMUSCULAR | Status: AC | PRN
  Administered 2023-02-09: 100 mL via INTRAVENOUS

## 2023-02-09 MED ORDER — MORPHINE SULFATE (PF) 4 MG/ML IV SOLN
4.0000 mg | Freq: Once | INTRAVENOUS | Status: AC
  Administered 2023-02-09: 4 mg via INTRAVENOUS
  Filled 2023-02-09: qty 1

## 2023-02-09 MED ORDER — ONDANSETRON HCL 4 MG PO TABS: 4.0000 mg | ORAL_TABLET | Freq: Four times a day (QID) | ORAL | Status: DC | PRN

## 2023-02-09 MED ORDER — SENNOSIDES-DOCUSATE SODIUM 8.6-50 MG PO TABS: 1.0000 | ORAL_TABLET | Freq: Every evening | ORAL | Status: DC | PRN

## 2023-02-09 MED ORDER — ACETAMINOPHEN 325 MG PO TABS: 650.0000 mg | ORAL_TABLET | Freq: Four times a day (QID) | ORAL | Status: DC | PRN

## 2023-02-09 MED ORDER — ONDANSETRON 4 MG PO TBDP
4.0000 mg | ORAL_TABLET | Freq: Once | ORAL | Status: AC
  Administered 2023-02-09: 4 mg via ORAL
  Filled 2023-02-09: qty 1

## 2023-02-09 MED ORDER — OXYCODONE-ACETAMINOPHEN 5-325 MG PO TABS
1.0000 | ORAL_TABLET | ORAL | Status: DC | PRN
  Administered 2023-02-09: 1 via ORAL
  Filled 2023-02-09: qty 1

## 2023-02-09 MED ORDER — PANTOPRAZOLE SODIUM 40 MG IV SOLR
40.0000 mg | Freq: Two times a day (BID) | INTRAVENOUS | Status: DC
  Administered 2023-02-10 (×2): 40 mg via INTRAVENOUS
  Filled 2023-02-09 (×2): qty 10

## 2023-02-09 MED ORDER — SODIUM CHLORIDE 0.9 % IV SOLN
12.5000 mg | Freq: Once | INTRAVENOUS | Status: AC
  Administered 2023-02-09: 12.5 mg via INTRAVENOUS
  Filled 2023-02-09: qty 0.5
  Filled 2023-02-09: qty 12.5

## 2023-02-09 MED ORDER — MECLIZINE HCL 25 MG PO TABS
25.0000 mg | ORAL_TABLET | Freq: Once | ORAL | Status: AC
  Administered 2023-02-09: 25 mg via ORAL
  Filled 2023-02-09: qty 1

## 2023-02-09 MED ORDER — ONDANSETRON HCL 4 MG/2ML IJ SOLN: 4.0000 mg | Freq: Four times a day (QID) | INTRAMUSCULAR | Status: DC | PRN

## 2023-02-09 MED ORDER — SODIUM CHLORIDE 0.9 % IV BOLUS
1000.0000 mL | Freq: Once | INTRAVENOUS | Status: AC
  Administered 2023-02-09: 1000 mL via INTRAVENOUS

## 2023-02-09 MED ORDER — POTASSIUM CHLORIDE IN NACL 40-0.9 MEQ/L-% IV SOLN
INTRAVENOUS | Status: AC
  Filled 2023-02-09: qty 1000

## 2023-02-09 MED ORDER — ALUM & MAG HYDROXIDE-SIMETH 200-200-20 MG/5ML PO SUSP
30.0000 mL | Freq: Once | ORAL | Status: AC
  Administered 2023-02-09: 30 mL via ORAL
  Filled 2023-02-09: qty 30

## 2023-02-09 NOTE — ED Notes (Signed)
Pt reporting that the nausea is returning. Pt is dry heaving.

## 2023-02-09 NOTE — ED Notes (Signed)
Patient transported to Ultrasound 

## 2023-02-09 NOTE — Hospital Course (Signed)
Keith Lucas is a Mandarin speaking 67 y.o. male with no known significant medical history who is admitted for intractable nausea and vomiting in setting of recent influenza A infection.

## 2023-02-09 NOTE — ED Triage Notes (Signed)
Patient reports left sided abdominal pain that started a few days ago, but has worsened today. C/o nausea and vomiting. Denies diarrhea.. Recent dx of flu.

## 2023-02-09 NOTE — H&P (Signed)
History and Physical    Keith Lucas ZOX:[id number] DOB: October 10, 1955 DOA: 02/09/2023  PCP: Patient, No Pcp Per  Patient coming from: Home  I have personally briefly reviewed patient's old medical records in [facility]  Chief Complaint: Abdominal pain, nausea, vomiting  HPI: Keith Lucas is a Mandarin speaking 67 y.o. male with no known significant medical history who presents to the ED for evaluation of abdominal pain, nausea, vomiting.  Remote video interpreter utilized to assist with communication.  Patient's son present at bedside.  Patient was initially seen in the ED 02/07/2023 for myalgias and URI symptoms for previous 2 days.  He has some associated abdominal discomfort and nausea with vomiting.  He tested positive for influenza A.  He was given IV fluids, antiemetics, Toradol with improvement and discharged to home.  Patient states for the last couple days he has been having upper abdominal pain associated with nausea and vomiting.  Pain is worse when eating.  He says he did eat some cold pizza prior to symptom onset.  He reports some small volume loose stool but otherwise not really having any diarrhea.  He has had some chills and diaphoresis that occurs during vomiting episodes.  The only medication he has been taking is esomeprazole which helped at first but epigastric pain has returned.  ED Course  Labs/Imaging on admission: I have personally reviewed following labs and imaging studies.  Initial vitals showed BP 100/55, pulse 77, RR 14, temp 98.4 F, SpO2 98% on room air.  Labs showed WBC 3.5, hemoglobin 11.8, platelets 178,000, sodium 136, potassium 3.2, bicarb 30, BUN 10, creatinine 0.55, serum glucose 103, LFTs within normal limits, lipase 37.  Urinalysis negative nitrites, negative leukocytes, 0-5 RBCs and WBCs, rare bacteria on microscopy.  CT abdomen/pelvis with contrast was limited by motion.  No bowel obstruction, free air, or free fluid.  Question of some mild gastric fold  thickening with trace stranding noted.  Gallstones in the nondilated gallbladder noted.  RUQ abdominal ultrasound showed cholelithiasis and focal gallbladder wall thickening without evidence of acute cholecystitis.  CBD diameter 4.0 mm.  Patient was given 1 L normal saline, IV Phenergan, Zofran, Maalox, IV morphine, oral K 40 mEq.  EDP discussed with on-call general surgery Dr. Fredricka Bonine who recommended medical admission and their team will see in the morning.  The hospitalist service was consulted to admit for further evaluation and management.  Review of Systems: All systems reviewed and are negative except as documented in history of present illness above.   Past Medical History:  Diagnosis Date   Allergy     History reviewed. No pertinent surgical history.  Social History:  reports that he has never smoked. He does not have any smokeless tobacco history on file. No history on file for alcohol use and drug use.  No Known Allergies  History reviewed. No pertinent family history.   Prior to Admission medications   Medication Sig Start Date End Date Taking? Authorizing Provider  loratadine-pseudoephedrine (CLARITIN-D 24-HOUR) 10-240 MG 24 hr tablet Take 1 tablet by mouth as needed for allergies.     [provider]  omeprazole (PRILOSEC) 20 MG capsule Take 1 capsule (20 mg total) by mouth daily. Patient not taking: Reported on 05/11/2019 09/02/15   Ethelda Chick, MD    Physical Exam: Vitals:   02/09/23 0930 02/09/23 0936 02/09/23 1348 02/09/23 1730  BP: 104/63  (!) 100/55 118/66  Pulse: 88  77 71  Resp: 15  14 18   Temp:  98.7 F (37.1 C)  98.4 F (36.9 C) 98.2 F (36.8 C)  TempSrc: Oral  Oral Oral  SpO2: 96%  98% 99%  Weight:  51.7 kg    Height:  5\' 5"  (1.651 m)     Constitutional: Resting in bed, NAD, calm, comfortable Eyes: EOMI, lids and conjunctivae normal ENMT: Mucous membranes are moist. Posterior pharynx clear of any exudate or lesions.poor dentition.   Neck: normal, supple, no masses. Respiratory: clear to auscultation bilaterally, no wheezing, no crackles. Normal respiratory effort. No accessory muscle use.  Cardiovascular: Regular rate and rhythm, no murmurs / rubs / gallops. No extremity edema. 2+ pedal pulses. Abdomen: Mild epigastric tenderness, no masses palpated. No hepatosplenomegaly. Bowel sounds positive.  Musculoskeletal: no clubbing / cyanosis. No joint deformity upper and lower extremities. Good ROM, no contractures. Normal muscle tone.  Skin: no rashes, lesions, ulcers. No induration Neurologic: Sensation intact. Strength 5/5 in all 4.  Psychiatric: Normal judgment and insight. Alert and oriented x 3. Normal mood.   EKG: Not performed.  Assessment/Plan Principal Problem:   Intractable nausea and vomiting Active Problems:   Hypokalemia   Influenza A   Keith Lucas is a Mandarin speaking 67 y.o. male with no known significant medical history who is admitted for intractable nausea and vomiting in setting of recent influenza A infection.  Assessment and Plan: Abdominal pain/nausea/vomiting: Recently tested positive for influenza A.  Having persistent nausea, vomiting, mild epigastric pain.  CT A/P with question of gastritis.  Cholelithiasis noted without evidence of cholecystitis on CT or ultrasound.  LFTs within normal limits.  Suspect symptoms likely due to gastritis versus related to recent viral infection. -Start IV Protonix 40 mg BID -Antiemetics as needed -Continue IV fluid hydration overnight -Advance diet as tolerated  Hypokalemia: Supplementing.  Influenza A: Positive test on 12/8, initial symptoms 4 days prior to this admission.  He was not started on Tamiflu at time of diagnosis.  Not started at this time as patient not tolerating orals.  Mild leukopenia: WBC 3.5 on admit.  Suspect this is secondary to influenza A viral process.  Repeat labs in AM.   DVT prophylaxis: SCDs Start: 02/09/23 2137 Code Status:  Full code Family Communication: Son at bedside Disposition Plan: From home and likely discharge to home pending clinical progress Consults called: General Surgery Severity of Illness: The appropriate patient status for this patient is OBSERVATION. Observation status is judged to be reasonable and necessary in order to provide the required intensity of service to ensure the patient's safety. The patient's presenting symptoms, physical exam findings, and initial radiographic and laboratory data in the context of their medical condition is felt to place them at decreased risk for further clinical deterioration. Furthermore, it is anticipated that the patient will be medically stable for discharge from the hospital within 2 midnights of admission.   Darreld Mclean MD [facility]  If 7PM-7AM, please contact night-coverage [email or web address]  02/09/2023, 9:41 PM

## 2023-02-09 NOTE — ED Provider Notes (Signed)
[place] EMERGENCY DEPARTMENT AT [facility] Provider Note   CSN: [id number] Arrival date & time: 02/09/23  4098     History  Chief Complaint  Patient presents with   Abdominal Pain   Influenza    Keith Lucas is a 66 y.o. male.  The history is provided by the patient and medical records. The history is limited by a language barrier. A language interpreter was used.  Abdominal Pain Influenza    67 year old [place] speaking male presenting with complaint of abdominal pain.  Patient report for the past 3 days he has had pain to his upper abdomen.  Pain described as a very uncomfortable sensation worse with eating.  He endorsed some nausea but without vomiting.  His last bowel movement was today.  He did have some cold symptoms several days prior and was seen in the ED and subsequently diagnosed with having the flu.  He does not complain of any significant chest pain or shortness of breath or productive cough.  Denies any prior abdominal surgeries.  States food makes his pain worse.  Denies alcohol or tobacco abuse.  He mention medication that was prescribed to him recently did help but now the pain is returned.  I was able to obtain history using language interpreter.  Home Medications Prior to Admission medications   Medication Sig Start Date End Date Taking? Authorizing Provider  loratadine-pseudoephedrine (CLARITIN-D 24-HOUR) 10-240 MG 24 hr tablet Take 1 tablet by mouth as needed for allergies.     [provider]  omeprazole (PRILOSEC) 20 MG capsule Take 1 capsule (20 mg total) by mouth daily. Patient not taking: Reported on 05/11/2019 09/02/15   Ethelda Chick, MD      Allergies    Patient has no known allergies.    Review of Systems   Review of Systems  Gastrointestinal:  Positive for abdominal pain.  All other systems reviewed and are negative.   Physical Exam Updated Vital Signs BP (!) 100/55 (BP Location: Right Arm)   Pulse 77   Temp 98.4 F  (36.9 C) (Oral)   Resp 14   Ht 5\' 5"  (1.651 m)   Wt 51.7 kg   SpO2 98%   BMI 18.97 kg/m  Physical Exam Vitals and nursing note reviewed.  Constitutional:      General: He is not in acute distress.    Appearance: He is well-developed.  HENT:     Head: Atraumatic.  Eyes:     Conjunctiva/sclera: Conjunctivae normal.  Cardiovascular:     Rate and Rhythm: Normal rate and regular rhythm.  Pulmonary:     Effort: Pulmonary effort is normal.     Breath sounds: Normal breath sounds.  Abdominal:     Tenderness: There is abdominal tenderness in the epigastric area and left upper quadrant. There is no guarding or rebound. Negative signs include Murphy's sign, Rovsing's sign, McBurney's sign and psoas sign.     Hernia: No hernia is present.  Musculoskeletal:     Cervical back: Neck supple.  Skin:    Findings: No rash.  Neurological:     Mental Status: He is alert.     ED Results / Procedures / Treatments   Labs (all labs ordered are listed, but only abnormal results are displayed) Labs Reviewed  COMPREHENSIVE METABOLIC PANEL - Abnormal; Notable for the following components:      Result Value   Potassium 3.2 (*)    Chloride 97 (*)    Glucose, Bld 103 (*)  Creatinine, Ser 0.55 (*)    Calcium 8.3 (*)    Albumin 3.4 (*)    All other components within normal limits  CBC - Abnormal; Notable for the following components:   WBC 3.5 (*)    RBC 3.67 (*)    Hemoglobin 11.8 (*)    HCT 34.3 (*)    All other components within normal limits  URINALYSIS, ROUTINE W REFLEX MICROSCOPIC - Abnormal; Notable for the following components:   APPearance CLOUDY (*)    pH 9.0 (*)    Ketones, ur 20 (*)    Protein, ur 30 (*)    Bacteria, UA RARE (*)    All other components within normal limits  LIPASE, BLOOD    EKG None  Radiology US Abdomen Limited  Result Date: 02/09/2023 CLINICAL DATA:  Right upper quadrant pain x3 days. EXAM: ULTRASOUND ABDOMEN LIMITED RIGHT UPPER QUADRANT  COMPARISON:  None Available. FINDINGS: Gallbladder: A 1.0 cm gallstone is seen within the dependent portion of the gallbladder lumen. An area of focal gallbladder wall thickening is seen which measures approximately 5.0 mm. No sonographic Murphy sign noted by sonographer. Common bile duct: Diameter: 4.0 mm Liver: No focal lesion identified. Within normal limits in parenchymal echogenicity. Portal vein is patent on color Doppler imaging with normal direction of blood flow towards the liver. Other: None. IMPRESSION: Cholelithiasis and focal gallbladder wall thickening, without evidence of acute cholecystitis. Electronically Signed   By: Aram Candela M.D.   On: 02/09/2023 20:04   CT ABDOMEN PELVIS W CONTRAST  Result Date: 02/09/2023 CLINICAL DATA:  Left-sided abdominal pain that began a few days ago but worsening. Some nausea and vomiting. EXAM: CT ABDOMEN AND PELVIS WITH CONTRAST TECHNIQUE: Multidetector CT imaging of the abdomen and pelvis was performed using the standard protocol following bolus administration of intravenous contrast. RADIATION DOSE REDUCTION: This exam was performed according to the departmental dose-optimization program which includes automated exposure control, adjustment of the mA and/or kV according to patient size and/or use of iterative reconstruction technique. CONTRAST:  OMNIPAQUE IOHEXOL 300 MG/ML  SOLN COMPARISON:  Chest x-ray 02/07/2023 and older. FINDINGS: Diffuse breathing motion limits evaluation particularly of the upper abdomen and lower chest. Lower chest: Breathing motion at the lung bases. No pleural effusion. Hepatobiliary: Partially obscured by motion. Diffuse fatty liver infiltration with more focal fat deposition seen in the liver adjacent to the falciform ligament in segment 4. Patent portal vein. Calcified stones in the nondilated gallbladder. Pancreas: Unremarkable. No pancreatic ductal dilatation or surrounding inflammatory changes. Spleen: Normal in size  without focal abnormality. Adrenals/Urinary Tract: Adrenal glands are unremarkable. Kidneys are normal, without renal calculi, focal lesion, or hydronephrosis. Bladder is unremarkable. Stomach/Bowel: Stomach is underdistended. There is some subtle areas of wall thickening and edema along the stomach. Question some stranding adjacent. On this non oral contrast exam otherwise the small bowel is nondilated. Large bowel has a normal course and caliber with some scattered stool. Normal appendix in the right lower quadrant Vascular/Lymphatic: Aortic atherosclerosis. No enlarged abdominal or pelvic lymph nodes. Reproductive: Prostate is unremarkable. Other: No free air or free fluid. Musculoskeletal: Scattered degenerative changes of the spine and pelvis. Transitional lumbosacral segment. IMPRESSION: Evaluation significantly limited by motion. No bowel obstruction, free air or free fluid however there is question of some mild gastric fold thickening with trace stranding. Please correlate for gastritis or other process. Gallstones in the nondilated gallbladder. Electronically Signed   By: Karen Kays M.D.   On: 02/09/2023 17:14  Procedures Procedures    Medications Ordered in ED Medications  oxyCODONE-acetaminophen (PERCOCET/ROXICET) 5-325 MG per tablet 1 tablet (1 tablet Oral Given 02/09/23 0945)  ondansetron (ZOFRAN-ODT) disintegrating tablet 4 mg (4 mg Oral Given 02/09/23 0945)  morphine (PF) 4 MG/ML injection 4 mg (4 mg Intravenous Given 02/09/23 1616)  ondansetron (ZOFRAN) injection 4 mg (4 mg Intravenous Given 02/09/23 1620)  iohexol (OMNIPAQUE) 300 MG/ML solution 100 mL (100 mLs Intravenous Contrast Given 02/09/23 1630)  promethazine (PHENERGAN) 12.5 mg in sodium chloride 0.9 % 50 mL IVPB (0 mg Intravenous Stopped 02/09/23 1828)  alum & mag hydroxide-simeth (MAALOX/MYLANTA) 200-200-20 MG/5ML suspension 30 mL (30 mLs Oral Given 02/09/23 1810)  potassium chloride SA (KLOR-CON M) CR tablet 40 mEq (40  mEq Oral Given 02/09/23 1830)  sodium chloride 0.9 % bolus 1,000 mL (1,000 mLs Intravenous New Bag/Given 02/09/23 2053)  meclizine (ANTIVERT) tablet 25 mg (25 mg Oral Given 02/09/23 1921)    ED Course/ Medical Decision Making/ A&P                                 Medical Decision Making Amount and/or Complexity of Data Reviewed Labs: ordered. Radiology: ordered.  Risk OTC drugs. Prescription drug management.   BP (!) 100/55 (BP Location: Right Arm)   Pulse 77   Temp 98.4 F (36.9 C) (Oral)   Resp 14   Ht 5\' 5"  (1.651 m)   Wt 51.7 kg   SpO2 98%   BMI 18.97 kg/m   38:68 PM  67 year old [place] speaking male presenting with complaint of abdominal pain.  Patient report for the past 3 days he has had pain to his upper abdomen.  Pain described as a very uncomfortable sensation worse with eating.  He endorsed some nausea but without vomiting.  His last bowel movement was today.  He did have some cold symptoms several days prior and was seen in the ED and subsequently diagnosed with having the flu.  He does not complain of any significant chest pain or shortness of breath or productive cough.  Denies any prior abdominal surgeries.  States food makes his pain worse.  Denies alcohol or tobacco abuse.  He mention medication that was prescribed to him recently did help but now the pain is returned.  I was able to obtain history using language interpreter.  Exam remarkable for tenderness to epigastric and left upper quadrant on palpation no guarding no rebound tenderness negative Murphy sign, no pain at McBurney's point.  Heart with normal rate and rhythm, lungs are clear to auscultation bilaterally.  -Labs ordered, independently viewed and interpreted by me.  Labs remarkable for K+ 3.2, supplementation given.  Normal lipase.  UA cloudy without signs of infection -The patient was maintained on a cardiac monitor.  I personally viewed and interpreted the cardiac monitored which showed an  underlying rhythm of: NSR -Imaging independently viewed and interpreted by me and I agree with radiologist's interpretation.  Result remarkable for abd/pelvis CT is significant limited by motion but no SBO. Evidence suggestive of gastritis.  Gallstone without gallbladder dilation.  No mention of size of CBD, likely 2/2 to poorly read CT.  I have considered limited abdominal US.  -This patient presents to the ED for concern of abd pain, this involves an extensive number of treatment options, and is a complaint that carries with it a high risk of complications and morbidity.  The differential diagnosis includes acute cholecystitis, colitis,  gastritis, viral illness, pancreatitis, appendicitis -Co morbidities that complicate the patient evaluation includes none -Treatment includes antivert, potassium, zofran, phenergan, maloox, morphine, percocet -Reevaluation of the patient after these medicines showed that the patient stayed the same -PCP office notes or outside notes reviewed -Discussion with specialist general surgery Dr. Fredricka Bonine who request medicine admission and pt will be seen in the AM. I have consulted [facility] Dr. Allena Katz who agrees to see and will admit pt -Escalation to admission/observation considered: patient is agreeable with admission.   All questions was able to answer to patient satisfaction using language interpreter.        Final Clinical Impression(s) / ED Diagnoses Final diagnoses:  Intractable nausea and vomiting  Calculus of gallbladder without cholecystitis without obstruction    Rx / DC Orders ED Discharge Orders     None         Fayrene Helper, PA-C 02/09/23 2104    Margarita Grizzle, MD 02/10/23 1450

## 2023-02-09 NOTE — ED Notes (Signed)
Pt given water for PO challenge at this time.

## 2023-02-10 ENCOUNTER — Observation Stay (HOSPITAL_COMMUNITY): Payer: Medicare PPO

## 2023-02-10 ENCOUNTER — Encounter (HOSPITAL_COMMUNITY): Payer: Self-pay | Admitting: Internal Medicine

## 2023-02-10 DIAGNOSIS — R112 Nausea with vomiting, unspecified: Secondary | ICD-10-CM | POA: Diagnosis not present

## 2023-02-10 LAB — COMPREHENSIVE METABOLIC PANEL
ALT: 21 U/L (ref 0–44)
AST: 28 U/L (ref 15–41)
Albumin: 3 g/dL — ABNORMAL LOW (ref 3.5–5.0)
Alkaline Phosphatase: 68 U/L (ref 38–126)
Anion gap: 9 (ref 5–15)
BUN: 11 mg/dL (ref 8–23)
CO2: 24 mmol/L (ref 22–32)
Calcium: 7.8 mg/dL — ABNORMAL LOW (ref 8.9–10.3)
Chloride: 100 mmol/L (ref 98–111)
Creatinine, Ser: 0.65 mg/dL (ref 0.61–1.24)
GFR, Estimated: >60 mL/min (ref >60)
Glucose, Bld: 75 mg/dL (ref 70–99)
Potassium: 4 mmol/L (ref 3.5–5.1)
Sodium: 133 mmol/L — ABNORMAL LOW (ref 135–145)
Total Bilirubin: 0.7 mg/dL (ref <1.2)
Total Protein: 6.1 g/dL — ABNORMAL LOW (ref 6.5–8.1)

## 2023-02-10 LAB — CBC
HCT: 34.1 % — ABNORMAL LOW (ref 39.0–52.0)
Hemoglobin: 11 g/dL — ABNORMAL LOW (ref 13.0–17.0)
MCH: 31.6 pg (ref 26.0–34.0)
MCHC: 32.3 g/dL (ref 30.0–36.0)
MCV: 98 fL (ref 80.0–100.0)
Platelets: 152 10*3/uL (ref 150–400)
RBC: 3.48 MIL/uL — ABNORMAL LOW (ref 4.22–5.81)
RDW: 12.9 % (ref 11.5–15.5)
WBC: 3.8 10*3/uL — ABNORMAL LOW (ref 4.0–10.5)
nRBC: 0 % (ref 0.0–0.2)

## 2023-02-10 LAB — HIV ANTIBODY (ROUTINE TESTING W REFLEX): HIV Screen 4th Generation wRfx: NONREACTIVE

## 2023-02-10 NOTE — Progress Notes (Signed)
PROGRESS NOTE    Keith Lucas  ZOX:[id number] DOB: Oct 11, 1955 DOA: 02/09/2023 PCP: Patient, No Pcp Per   Brief Narrative:  HPI: Keith Lucas is a Mandarin speaking 67 y.o. male with no known significant medical history who presents to the ED for evaluation of abdominal pain, nausea, vomiting.  Remote video interpreter utilized to assist with communication.  Patient's son present at bedside.   Patient was initially seen in the ED 02/07/2023 for myalgias and URI symptoms for previous 2 days.  He has some associated abdominal discomfort and nausea with vomiting.  He tested positive for influenza A.  He was given IV fluids, antiemetics, Toradol with improvement and discharged to home.   Patient states for the last couple days he has been having upper abdominal pain associated with nausea and vomiting.  Pain is worse when eating.  He says he did eat some cold pizza prior to symptom onset.  He reports some small volume loose stool but otherwise not really having any diarrhea.  He has had some chills and diaphoresis that occurs during vomiting episodes.  The only medication he has been taking is esomeprazole which helped at first but epigastric pain has returned.   ED Course  Labs/Imaging on admission: I have personally reviewed following labs and imaging studies.   Initial vitals showed BP 100/55, pulse 77, RR 14, temp 98.4 F, SpO2 98% on room air.   Labs showed WBC 3.5, hemoglobin 11.8, platelets 178,000, sodium 136, potassium 3.2, bicarb 30, BUN 10, creatinine 0.55, serum glucose 103, LFTs within normal limits, lipase 37.  Urinalysis negative nitrites, negative leukocytes, 0-5 RBCs and WBCs, rare bacteria on microscopy.   CT abdomen/pelvis with contrast was limited by motion.  No bowel obstruction, free air, or free fluid.  Question of some mild gastric fold thickening with trace stranding noted.  Gallstones in the nondilated gallbladder noted.   RUQ abdominal ultrasound showed cholelithiasis and focal  gallbladder wall thickening without evidence of acute cholecystitis.  CBD diameter 4.0 mm.   Patient was given 1 L normal saline, IV Phenergan, Zofran, Maalox, IV morphine, oral K 40 mEq.  EDP discussed with on-call general surgery Dr. Fredricka Bonine who recommended medical admission and their team will see in the morning.  The hospitalist service was consulted to admit for further evaluation and management.  Assessment & Plan:   Principal Problem:   Intractable nausea and vomiting Active Problems:   Hypokalemia   Influenza A  Abdominal pain/nausea/vomiting/?  Gastritis: Recently tested positive for influenza A.  Having persistent nausea, vomiting, mild epigastric pain.  CT A/P with question of gastritis.  Cholelithiasis noted without evidence of cholecystitis on CT or ultrasound.  LFTs within normal limits.  Suspect symptoms likely due to gastritis versus related to recent viral infection.  However general surgery was consulted by ED for possible cholelithiasis induced nausea vomiting.  Surgery has seen him and they have ordered HIDA scan.  Currently on clear liquid diet.  If negative for cholecystitis, will slowly advance diet.  Continue IV Protonix twice daily.   Hypokalemia: Resolved.   Influenza A: Positive test on 12/8, initial symptoms 4 days prior to this admission.  He was not started on Tamiflu at time of diagnosis.  Not started at this time as patient not tolerating orals.  He is asymptomatic.   Mild leukopenia: WBC 3.5 on admit.  Suspect this is secondary to influenza A viral process.  Slight improvement today.  Will recommend repeating CBC at PCPs visit next week.  DVT prophylaxis: SCDs Start: 02/09/23 2137   Code Status: Full Code  Family Communication: Son present at bedside.  Plan of care discussed with patient in length and he/she verbalized understanding and agreed with it.  Status is: Observation The patient will require care spanning > 2 midnights and should be moved to  inpatient because: HIDA scan today.  Advancing diet slowly.   Estimated body mass index is 18.97 kg/m as calculated from the following:   Height as of this encounter: 5\' 5"  (1.651 m).   Weight as of this encounter: 51.7 kg.    Nutritional Assessment: Body mass index is 18.97 kg/m.Marland Kitchen Seen by dietician.  I agree with the assessment and plan as outlined below: Nutrition Status:        . Skin Assessment: I have examined the patient's skin and I agree with the wound assessment as performed by the wound care RN as outlined below:    Consultants:  General surgery  Procedures:  None  Antimicrobials:  Anti-infectives (From admission, onward)    None         Subjective: Seen and examined.  Son at the bedside who can speak [place] fairly well and prefers he helps with the communication.  He tells me that his father is feeling better, no abdominal pain or nausea or vomiting at the moment.  Objective: Vitals:   02/09/23 2306 02/10/23 0315 02/10/23 0613 02/10/23 0827  BP: 110/66 113/64 (!) 111/56 (!) 106/46  Pulse: 79 78 85 78  Resp: 18 18 18 18   Temp: 98.2 F (36.8 C) 98.3 F (36.8 C) 98.6 F (37 C) 98.7 F (37.1 C)  TempSrc: Oral Oral Oral Oral  SpO2: 99% 96% 95% 97%  Weight:      Height:        Intake/Output Summary (Last 24 hours) at 02/10/2023 1051 Last data filed at 02/10/2023 0604 Gross per 24 hour  Intake 581.93 ml  Output --  Net 581.93 ml   Filed Weights   02/09/23 0936  Weight: 51.7 kg    Examination:  General exam: Appears calm and comfortable  Respiratory system: Clear to auscultation. Respiratory effort normal. Cardiovascular system: S1 & S2 heard, RRR. No JVD, murmurs, rubs, gallops or clicks. No pedal edema. Gastrointestinal system: Abdomen is nondistended, soft and nontender. No organomegaly or masses felt. Normal bowel sounds heard. Central nervous system: Alert and oriented. No focal neurological deficits. Extremities: Symmetric 5 x 5  power. Skin: No rashes, lesions or ulcers Psychiatry: Judgement and insight appear normal. Mood & affect appropriate.    Data Reviewed: I have personally reviewed following labs and imaging studies  CBC: Recent Labs  Lab 02/07/23 1915 02/09/23 0945 02/10/23 0430  WBC 7.3 3.5* 3.8*  HGB 12.0* 11.8* 11.0*  HCT 34.6* 34.3* 34.1*  MCV 93.8 93.5 98.0  PLT 203 178 152   Basic Metabolic Panel: Recent Labs  Lab 02/07/23 1915 02/09/23 0945 02/10/23 0430  NA 131* 136 133*  K 3.4* 3.2* 4.0  CL 97* 97* 100  CO2 24 30 24   GLUCOSE 97 103* 75  BUN 14 10 11   CREATININE 0.72 0.55* 0.65  CALCIUM 8.4* 8.3* 7.8*   GFR: Estimated Creatinine Clearance: 67.3 mL/min (by C-G formula based on SCr of 0.65 mg/dL). Liver Function Tests: Recent Labs  Lab 02/07/23 1915 02/09/23 0945 02/10/23 0430  AST 29 28 28   ALT 26 23 21   ALKPHOS 86 75 68  BILITOT 0.7 0.5 0.7  PROT 7.2 6.9 6.1*  ALBUMIN 3.7  3.4* 3.0*   Recent Labs  Lab 02/07/23 1915 02/09/23 0945  LIPASE 32 37   No results for input(s): "AMMONIA" in the last 168 hours. Coagulation Profile: No results for input(s): "INR", "PROTIME" in the last 168 hours. Cardiac Enzymes: No results for input(s): "CKTOTAL", "CKMB", "CKMBINDEX", "TROPONINI" in the last 168 hours. BNP (last 3 results) No results for input(s): "PROBNP" in the last 8760 hours. HbA1C: No results for input(s): "HGBA1C" in the last 72 hours. CBG: No results for input(s): "GLUCAP" in the last 168 hours. Lipid Profile: No results for input(s): "CHOL", "HDL", "LDLCALC", "TRIG", "CHOLHDL", "LDLDIRECT" in the last 72 hours. Thyroid Function Tests: No results for input(s): "TSH", "T4TOTAL", "FREET4", "T3FREE", "THYROIDAB" in the last 72 hours. Anemia Panel: No results for input(s): "VITAMINB12", "FOLATE", "FERRITIN", "TIBC", "IRON", "RETICCTPCT" in the last 72 hours. Sepsis Labs: No results for input(s): "PROCALCITON", "LATICACIDVEN" in the last 168 hours.  Recent  Results (from the past 240 hour(s))  Resp panel by RT-PCR (RSV, Flu A&B, Covid) Urine, Clean Catch     Status: Abnormal   Collection Time: 02/07/23  7:18 PM   Specimen: Urine, Clean Catch; Nasal Swab  Result Value Ref Range Status   SARS Coronavirus 2 by RT PCR NEGATIVE NEGATIVE Final    Comment: (NOTE) SARS-CoV-2 target nucleic acids are NOT DETECTED.  The SARS-CoV-2 RNA is generally detectable in upper respiratory specimens during the acute phase of infection. The lowest concentration of SARS-CoV-2 viral copies this assay can detect is 138 copies/mL. A negative result does not preclude SARS-Cov-2 infection and should not be used as the sole basis for treatment or other patient management decisions. A negative result may occur with  improper specimen collection/handling, submission of specimen other than nasopharyngeal swab, presence of viral mutation(s) within the areas targeted by this assay, and inadequate number of viral copies(<138 copies/mL). A negative result must be combined with clinical observations, patient history, and epidemiological information. The expected result is Negative.  Fact Sheet for Patients:  BloggerCourse.com  Fact Sheet for Healthcare Providers:  SeriousBroker.it  This test is no t yet approved or cleared by the [place] FDA and  has been authorized for detection and/or diagnosis of SARS-CoV-2 by FDA under an Emergency Use Authorization (EUA). This EUA will remain  in effect (meaning this test can be used) for the duration of the COVID-19 declaration under Section 564(b)(1) of the Act, 21 U.S.C.section 360bbb-3(b)(1), unless the authorization is terminated  or revoked sooner.       Influenza A by PCR POSITIVE (A) NEGATIVE Final   Influenza B by PCR NEGATIVE NEGATIVE Final    Comment: (NOTE) The Xpert Xpress SARS-CoV-2/FLU/RSV plus assay is intended as an aid in the diagnosis of influenza from  Nasopharyngeal swab specimens and should not be used as a sole basis for treatment. Nasal washings and aspirates are unacceptable for Xpert Xpress SARS-CoV-2/FLU/RSV testing.  Fact Sheet for Patients: BloggerCourse.com  Fact Sheet for Healthcare Providers: SeriousBroker.it  This test is not yet approved or cleared by the [place] FDA and has been authorized for detection and/or diagnosis of SARS-CoV-2 by FDA under an Emergency Use Authorization (EUA). This EUA will remain in effect (meaning this test can be used) for the duration of the COVID-19 declaration under Section 564(b)(1) of the Act, 21 U.S.C. section 360bbb-3(b)(1), unless the authorization is terminated or revoked.     Resp Syncytial Virus by PCR NEGATIVE NEGATIVE Final    Comment: (NOTE) Fact Sheet for Patients: BloggerCourse.com  Fact  Sheet for Healthcare Providers: SeriousBroker.it  This test is not yet approved or cleared by the [place] and has been authorized for detection and/or diagnosis of SARS-CoV-2 by FDA under an Emergency Use Authorization (EUA). This EUA will remain in effect (meaning this test can be used) for the duration of the COVID-19 declaration under Section 564(b)(1) of the Act, 21 U.S.C. section 360bbb-3(b)(1), unless the authorization is terminated or revoked.  Performed at [facility], [address] [place], [place] [address]      Radiology Studies: US Abdomen Limited  Result Date: 02/09/2023 CLINICAL DATA:  Right upper quadrant pain x3 days. EXAM: ULTRASOUND ABDOMEN LIMITED RIGHT UPPER QUADRANT COMPARISON:  None Available. FINDINGS: Gallbladder: A 1.0 cm gallstone is seen within the dependent portion of the gallbladder lumen. An area of focal gallbladder wall thickening is seen which measures approximately 5.0 mm. No sonographic Murphy sign noted by  sonographer. Common bile duct: Diameter: 4.0 mm Liver: No focal lesion identified. Within normal limits in parenchymal echogenicity. Portal vein is patent on color Doppler imaging with normal direction of blood flow towards the liver. Other: None. IMPRESSION: Cholelithiasis and focal gallbladder wall thickening, without evidence of acute cholecystitis. Electronically Signed   By: Aram Candela M.D.   On: 02/09/2023 20:04   CT ABDOMEN PELVIS W CONTRAST  Result Date: 02/09/2023 CLINICAL DATA:  Left-sided abdominal pain that began a few days ago but worsening. Some nausea and vomiting. EXAM: CT ABDOMEN AND PELVIS WITH CONTRAST TECHNIQUE: Multidetector CT imaging of the abdomen and pelvis was performed using the standard protocol following bolus administration of intravenous contrast. RADIATION DOSE REDUCTION: This exam was performed according to the departmental dose-optimization program which includes automated exposure control, adjustment of the mA and/or kV according to patient size and/or use of iterative reconstruction technique. CONTRAST:  OMNIPAQUE IOHEXOL 300 MG/ML  SOLN COMPARISON:  Chest x-ray 02/07/2023 and older. FINDINGS: Diffuse breathing motion limits evaluation particularly of the upper abdomen and lower chest. Lower chest: Breathing motion at the lung bases. No pleural effusion. Hepatobiliary: Partially obscured by motion. Diffuse fatty liver infiltration with more focal fat deposition seen in the liver adjacent to the falciform ligament in segment 4. Patent portal vein. Calcified stones in the nondilated gallbladder. Pancreas: Unremarkable. No pancreatic ductal dilatation or surrounding inflammatory changes. Spleen: Normal in size without focal abnormality. Adrenals/Urinary Tract: Adrenal glands are unremarkable. Kidneys are normal, without renal calculi, focal lesion, or hydronephrosis. Bladder is unremarkable. Stomach/Bowel: Stomach is underdistended. There is some subtle areas of  wall thickening and edema along the stomach. Question some stranding adjacent. On this non oral contrast exam otherwise the small bowel is nondilated. Large bowel has a normal course and caliber with some scattered stool. Normal appendix in the right lower quadrant Vascular/Lymphatic: Aortic atherosclerosis. No enlarged abdominal or pelvic lymph nodes. Reproductive: Prostate is unremarkable. Other: No free air or free fluid. Musculoskeletal: Scattered degenerative changes of the spine and pelvis. Transitional lumbosacral segment. IMPRESSION: Evaluation significantly limited by motion. No bowel obstruction, free air or free fluid however there is question of some mild gastric fold thickening with trace stranding. Please correlate for gastritis or other process. Gallstones in the nondilated gallbladder. Electronically Signed   By: Karen Kays M.D.   On: 02/09/2023 17:14    Scheduled Meds:  pantoprazole (PROTONIX) IV  40 mg Intravenous Q12H   Continuous Infusions:   LOS: 0 days   Hughie Closs, MD [facility]  02/10/2023, 10:51 AM   *Please  note that this is a verbal dictation therefore any spelling or grammatical errors are due to the "Dragon Medical One" system interpretation.  Please page via Amion and do not message via secure chat for urgent patient care matters. Secure chat can be used for non urgent patient care matters.  How to contact the [facility] Attending or Consulting provider 7A - 7P or covering provider during after hours 7P -7A, for this patient?  Check the care team in [facility] and look for a) attending/consulting [facility] provider listed and b) the [facility] team listed. Page or secure chat 7A-7P. Log into [email or web address] and use [place]'s universal password to access. If you do not have the password, please contact the hospital operator. Locate the [facility] provider you are looking for under [facility] and page to a number that you can be directly reached. If you still have difficulty  reaching the provider, please page the [facility] (Director on Call) for the Hospitalists listed on amion for assistance.

## 2023-02-10 NOTE — Progress Notes (Signed)
Patient left AMA, MD made aware

## 2023-02-10 NOTE — Consult Note (Signed)
Consult Note  Keith Lucas [birth year]  [id number].    Requesting MD: Dr. Allena Katz Chief Complaint/Reason for Consult: cholelithiasis, abdominal pain, n/v  HPI:  67 y.o. male without significant medical history who presented to [name] with URI symptoms, abdominal pain, n/v. URI symptoms began several days ago and he was diagnosed with influenza at a prior ED visit. On Sunday he developed mild nausea that worsened to abdominal pain and vomiting after eating pizza. Since then pain continues to be exacerbated by food intake and he has ongoing mild nausea despite being NPO. He states he has never had an episode of similar symptoms prior. When asked where his pain is greatest he points to LLQ. Last BM was yesterday and normal. He usually has a BM daily. He denies fever, chills, SHOB.  Work up in ED with cholelithiasis on [place] and possible gastritis on CT.  He is accompanied by his son  Substance use: none Allergies: none Blood thinners: none Past Surgeries: no prior abdominal surgeries   ROS: Reviewed and as above  History reviewed. No pertinent family history.  Past Medical History:  Diagnosis Date   Allergy     History reviewed. No pertinent surgical history.  Social History:  reports that he has never smoked. He does not have any smokeless tobacco history on file. No history on file for alcohol use and drug use.  Allergies: No Known Allergies  Medications Prior to Admission  Medication Sig Dispense Refill   loratadine-pseudoephedrine (CLARITIN-D 24-HOUR) 10-240 MG 24 hr tablet Take 1 tablet by mouth as needed for allergies.      omeprazole (PRILOSEC) 20 MG capsule Take 1 capsule (20 mg total) by mouth daily. 30 capsule 5    Blood pressure (!) 106/46, pulse 78, temperature 98.7 F (37.1 C), temperature source Oral, resp. rate 18, height 5\' 5"  (1.651 m), weight 51.7 kg, SpO2 97%. Physical Exam: General: pleasant, WD, male who is laying in bed in NAD HEENT: head is  normocephalic, atraumatic.  Sclera are noninjected.  Pupils equal and round. EOMs intact.  Ears and nose without any masses or lesions.  Mouth is pink and moist Heart: regular, rate, and rhythm.  Normal s1,s2. No obvious murmurs, gallops, or rubs noted.  Palpable radial and pedal pulses bilaterally Lungs: CTAB, no wheezes, rhonchi, or rales noted.  Respiratory effort nonlabored Abd: soft, ND, +BS, no masses, hernias, or organomegaly. NT on exam. Negative murphy's sign MSK: all 4 extremities are symmetrical with no cyanosis, clubbing, or edema. Skin: warm and dry with no masses, lesions, or rashes Neuro: Cranial nerves 2-12 grossly intact, sensation is normal throughout Psych: A&Ox3 with an appropriate affect.    Results for orders placed or performed during the hospital encounter of 02/09/23 (from the past 48 hour(s))  Lipase, blood     Status: None   Collection Time: 02/09/23  9:45 AM  Result Value Ref Range   Lipase 37 11 - 51 U/L    Comment: Performed at [facility], [address], [place], [place] [address]  Comprehensive metabolic panel     Status: Abnormal   Collection Time: 02/09/23  9:45 AM  Result Value Ref Range   Sodium 136 135 - 145 mmol/L   Potassium 3.2 (L) 3.5 - 5.1 mmol/L   Chloride 97 (L) 98 - 111 mmol/L   CO2 30 22 - 32 mmol/L   Glucose, Bld 103 (H) 70 - 99 mg/dL    Comment: Glucose reference range applies  only to samples taken after fasting for at least 8 hours.   BUN 10 8 - 23 mg/dL   Creatinine, Ser 4.78 (L) 0.61 - 1.24 mg/dL   Calcium 8.3 (L) 8.9 - 10.3 mg/dL   Total Protein 6.9 6.5 - 8.1 g/dL   Albumin 3.4 (L) 3.5 - 5.0 g/dL   AST 28 15 - 41 U/L   ALT 23 0 - 44 U/L   Alkaline Phosphatase 75 38 - 126 U/L   Total Bilirubin 0.5 <1.2 mg/dL   GFR, Estimated >29 >56 mL/min    Comment: (NOTE) Calculated using the CKD-EPI Creatinine Equation (2021)    Anion gap 9 5 - 15    Comment: Performed at [facility], [address] [place], [place] [address]  CBC     Status: Abnormal   Collection Time: 02/09/23  9:45 AM  Result Value Ref Range   WBC 3.5 (L) 4.0 - 10.5 K/uL   RBC 3.67 (L) 4.22 - 5.81 MIL/uL   Hemoglobin 11.8 (L) 13.0 - 17.0 g/dL   HCT 65.7 (L) 84.6 - 96.2 %   MCV 93.5 80.0 - 100.0 fL   MCH 32.2 26.0 - 34.0 pg   MCHC 34.4 30.0 - 36.0 g/dL   RDW 95.2 84.1 - 32.4 %   Platelets 178 150 - 400 K/uL   nRBC 0.0 0.0 - 0.2 %    Comment: Performed at [facility], [address], [place], [place] [address]  Urinalysis, Routine w reflex microscopic -Urine, Clean Catch     Status: Abnormal   Collection Time: 02/09/23  9:45 AM  Result Value Ref Range   Color, Urine YELLOW YELLOW   APPearance CLOUDY (A) CLEAR   Specific Gravity, Urine 1.014 1.005 - 1.030   pH 9.0 (H) 5.0 - 8.0   Glucose, UA NEGATIVE NEGATIVE mg/dL   Hgb urine dipstick NEGATIVE NEGATIVE   Bilirubin Urine NEGATIVE NEGATIVE   Ketones, ur 20 (A) NEGATIVE mg/dL   Protein, ur 30 (A) NEGATIVE mg/dL   Nitrite NEGATIVE NEGATIVE   Leukocytes,Ua NEGATIVE NEGATIVE   RBC / HPF 0-5 0 - 5 RBC/hpf   WBC, UA 0-5 0 - 5 WBC/hpf   Bacteria, UA RARE (A) NONE SEEN   Squamous Epithelial / HPF 0-5 0 - 5 /HPF   Mucus PRESENT     Comment: Performed at [facility], [address], [place], [place] [address]  Comprehensive metabolic panel     Status: Abnormal   Collection Time: 02/10/23  4:30 AM  Result Value Ref Range   Sodium 133 (L) 135 - 145 mmol/L   Potassium 4.0 3.5 - 5.1 mmol/L   Chloride 100 98 - 111 mmol/L   CO2 24 22 - 32 mmol/L   Glucose, Bld 75 70 - 99 mg/dL    Comment: Glucose reference range applies only to samples taken after fasting for at least 8 hours.   BUN 11 8 - 23 mg/dL   Creatinine, Ser 6.44 0.61 - 1.24 mg/dL   Calcium 7.8 (L) 8.9 - 10.3 mg/dL   Total Protein 6.1 (L) 6.5 - 8.1 g/dL   Albumin 3.0 (L) 3.5 - 5.0 g/dL   AST 28 15 - 41 U/L   ALT 21 0 - 44 U/L   Alkaline Phosphatase 68 38 - 126 U/L    Total Bilirubin 0.7 <1.2 mg/dL   GFR, Estimated >03 >47 mL/min    Comment: (NOTE) Calculated using the CKD-EPI Creatinine Equation (2021)  Anion gap 9 5 - 15    Comment: Performed at [facility], [address], [place], [place] [address]  CBC     Status: Abnormal   Collection Time: 02/10/23  4:30 AM  Result Value Ref Range   WBC 3.8 (L) 4.0 - 10.5 K/uL   RBC 3.48 (L) 4.22 - 5.81 MIL/uL   Hemoglobin 11.0 (L) 13.0 - 17.0 g/dL   HCT 21.3 (L) 08.6 - 57.8 %   MCV 98.0 80.0 - 100.0 fL   MCH 31.6 26.0 - 34.0 pg   MCHC 32.3 30.0 - 36.0 g/dL   RDW 46.9 62.9 - 52.8 %   Platelets 152 150 - 400 K/uL   nRBC 0.0 0.0 - 0.2 %    Comment: Performed at [facility], [address] [place], [place] [address]   US Abdomen Limited  Result Date: 02/09/2023 CLINICAL DATA:  Right upper quadrant pain x3 days. EXAM: ULTRASOUND ABDOMEN LIMITED RIGHT UPPER QUADRANT COMPARISON:  None Available. FINDINGS: Gallbladder: A 1.0 cm gallstone is seen within the dependent portion of the gallbladder lumen. An area of focal gallbladder wall thickening is seen which measures approximately 5.0 mm. No sonographic Murphy sign noted by sonographer. Common bile duct: Diameter: 4.0 mm Liver: No focal lesion identified. Within normal limits in parenchymal echogenicity. Portal vein is patent on color Doppler imaging with normal direction of blood flow towards the liver. Other: None. IMPRESSION: Cholelithiasis and focal gallbladder wall thickening, without evidence of acute cholecystitis. Electronically Signed   By: Aram Candela M.D.   On: 02/09/2023 20:04   CT ABDOMEN PELVIS W CONTRAST  Result Date: 02/09/2023 CLINICAL DATA:  Left-sided abdominal pain that began a few days ago but worsening. Some nausea and vomiting. EXAM: CT ABDOMEN AND PELVIS WITH CONTRAST TECHNIQUE: Multidetector CT imaging of the abdomen and pelvis was performed using the standard protocol following bolus  administration of intravenous contrast. RADIATION DOSE REDUCTION: This exam was performed according to the departmental dose-optimization program which includes automated exposure control, adjustment of the mA and/or kV according to patient size and/or use of iterative reconstruction technique. CONTRAST:  OMNIPAQUE IOHEXOL 300 MG/ML  SOLN COMPARISON:  Chest x-ray 02/07/2023 and older. FINDINGS: Diffuse breathing motion limits evaluation particularly of the upper abdomen and lower chest. Lower chest: Breathing motion at the lung bases. No pleural effusion. Hepatobiliary: Partially obscured by motion. Diffuse fatty liver infiltration with more focal fat deposition seen in the liver adjacent to the falciform ligament in segment 4. Patent portal vein. Calcified stones in the nondilated gallbladder. Pancreas: Unremarkable. No pancreatic ductal dilatation or surrounding inflammatory changes. Spleen: Normal in size without focal abnormality. Adrenals/Urinary Tract: Adrenal glands are unremarkable. Kidneys are normal, without renal calculi, focal lesion, or hydronephrosis. Bladder is unremarkable. Stomach/Bowel: Stomach is underdistended. There is some subtle areas of wall thickening and edema along the stomach. Question some stranding adjacent. On this non oral contrast exam otherwise the small bowel is nondilated. Large bowel has a normal course and caliber with some scattered stool. Normal appendix in the right lower quadrant Vascular/Lymphatic: Aortic atherosclerosis. No enlarged abdominal or pelvic lymph nodes. Reproductive: Prostate is unremarkable. Other: No free air or free fluid. Musculoskeletal: Scattered degenerative changes of the spine and pelvis. Transitional lumbosacral segment. IMPRESSION: Evaluation significantly limited by motion. No bowel obstruction, free air or free fluid however there is question of some mild gastric fold thickening with trace stranding. Please correlate for gastritis or other  process. Gallstones in the  nondilated gallbladder. Electronically Signed   By: Karen Kays M.D.   On: 02/09/2023 17:14      Assessment/Plan Abdominal pain, nausea, vomiting Cholelithiasis  Influenza A  Patient seen and examined and relevant labs and imaging personally reviewed. Influenza A positive as of 12/8. He has abdominal symptoms and cholelithiasis. History and exam are not completely consistent with gallbladder etiology of symptoms and may be related to flu. Afebrile, no leukocytosis and LFTs/T bili WNL. Will get HIDA for further evaluation. If positive would recommend antibiotics and likely laparoscopic cholecystectomy this admission.   FEN: NPO for HIDA ID: none currently indicated VTE: okay for chemical prophylaxis from surgical standpoint  Due to language barrier, an interpreter was present during the history-taking and subsequent discussion (and for part of the physical exam) with this patient.   I reviewed ED provider notes, hospitalist notes, last 24 h vitals and pain scores, last 48 h intake and output, last 24 h labs and trends, and last 24 h imaging results.   Eric Form, [facility] 02/10/2023, 9:37 AM Please see Amion for pager number during day hours 7:00am-4:30pm

## 2023-02-10 NOTE — Progress Notes (Signed)
HIDA negative, can advance diet as tolerated from surgical standpoint. Surgical service will remain available as needed.   Eric Form, [facility] 02/10/2023, 3:24 PM Please see Amion for pager number during day hours 7:00am-4:30pm

## 2023-02-11 NOTE — Discharge Summary (Signed)
@LOGO @   PT LEFT AMA SUMMARY  Keith Lucas MRN - [id number] DOB - 11-Dec-1955  Date of Admission - 02/09/2023 Date LEFT AMA: 02/10/2023  Attending Physician:  Hughie Closs, MD  Patient's PCP:  Patient, No Pcp Per  Disposition: LEFT AMA  Follow-up Appts:  Not able to be arranged or discussed as pt LEFT AMA  Diagnoses at time pt LEFT AMA: Cholelithiasis/nausea vomiting/gastritis   Initial presentation: HPI: Keith Lucas is a Mandarin speaking 67 y.o. male with no known significant medical history who presents to the ED for evaluation of abdominal pain, nausea, vomiting.  Remote video interpreter utilized to assist with communication.  Patient's son present at bedside.   Patient was initially seen in the ED 02/07/2023 for myalgias and URI symptoms for previous 2 days.  He has some associated abdominal discomfort and nausea with vomiting.  He tested positive for influenza A.  He was given IV fluids, antiemetics, Toradol with improvement and discharged to home.   Patient states for the last couple days he has been having upper abdominal pain associated with nausea and vomiting.  Pain is worse when eating.  He says he did eat some cold pizza prior to symptom onset.  He reports some small volume loose stool but otherwise not really having any diarrhea.  He has had some chills and diaphoresis that occurs during vomiting episodes.  The only medication he has been taking is esomeprazole which helped at first but epigastric pain has returned.   ED Course  Labs/Imaging on admission: I have personally reviewed following labs and imaging studies.   Initial vitals showed BP 100/55, pulse 77, RR 14, temp 98.4 F, SpO2 98% on room air.   Labs showed WBC 3.5, hemoglobin 11.8, platelets 178,000, sodium 136, potassium 3.2, bicarb 30, BUN 10, creatinine 0.55, serum glucose 103, LFTs within normal limits, lipase 37.  Urinalysis negative nitrites, negative leukocytes, 0-5 RBCs and WBCs, rare bacteria on  microscopy.   CT abdomen/pelvis with contrast was limited by motion.  No bowel obstruction, free air, or free fluid.  Question of some mild gastric fold thickening with trace stranding noted.  Gallstones in the nondilated gallbladder noted.   RUQ abdominal ultrasound showed cholelithiasis and focal gallbladder wall thickening without evidence of acute cholecystitis.  CBD diameter 4.0 mm.   Patient was given 1 L normal saline, IV Phenergan, Zofran, Maalox, IV morphine, oral K 40 mEq.  EDP discussed with on-call general surgery Dr. Fredricka Bonine who recommended medical admission and their team will see in the morning.  The hospitalist service was consulted to admit for further evaluation and management.  Hospital Course: Listed below are the active problems present, and the status of the care of these problems, at the time the pt decided to LEAVE AMA: Patient was admitted with the symptoms of nausea vomiting abdominal pain, this was likely secondary to gastritis but he was also found to have cholelithiasis.  Due to concern of cholecystitis and symptomatic cholelithiasis, general surgery was consulted, patient underwent HIDA scan which ruled out cholecystitis.  Patient was cleared by general surgery however we had just advanced his diet and it was not reassured yet that patient had tolerated the diet but patient decided to leave AGAINST MEDICAL ADVICE.    Medication List    Unable to be finalized as pt LEFT AMA  Day of Discharge Wt Readings from Last 3 Encounters:  02/09/23 51.7 kg  02/07/23 51.7 kg  05/11/19 52 kg   Temp Readings from Last 3  Encounters:  02/10/23 98.5 F (36.9 C) (Oral)  02/07/23 98.3 F (36.8 C) (Oral)  05/11/19 97.9 F (36.6 C) (Oral)   BP Readings from Last 3 Encounters:  02/10/23 (!) 108/55  02/07/23 126/71  05/11/19 (!) 142/73   Pulse Readings from Last 3 Encounters:  02/10/23 77  02/07/23 99  05/11/19 72    Physical Exam: Exam not able to be completed at time  of d/c as pt LEFT AMA  6:55 PM 02/11/23  Hughie Closs, MD [facility] Office  [phone]

## 2023-06-17 DIAGNOSIS — K5904 Chronic idiopathic constipation: Secondary | ICD-10-CM | POA: Diagnosis not present

## 2023-06-17 DIAGNOSIS — R1013 Epigastric pain: Secondary | ICD-10-CM | POA: Diagnosis not present

## 2023-06-17 DIAGNOSIS — R935 Abnormal findings on diagnostic imaging of other abdominal regions, including retroperitoneum: Secondary | ICD-10-CM | POA: Diagnosis not present

## 2023-06-17 DIAGNOSIS — Z1211 Encounter for screening for malignant neoplasm of colon: Secondary | ICD-10-CM | POA: Diagnosis not present

## 2023-06-17 DIAGNOSIS — Z0000 Encounter for general adult medical examination without abnormal findings: Secondary | ICD-10-CM | POA: Diagnosis not present

## 2023-06-17 DIAGNOSIS — K2940 Chronic atrophic gastritis without bleeding: Secondary | ICD-10-CM | POA: Diagnosis not present

## 2023-06-17 DIAGNOSIS — Z7689 Persons encountering health services in other specified circumstances: Secondary | ICD-10-CM | POA: Diagnosis not present

## 2023-06-17 DIAGNOSIS — Z13220 Encounter for screening for lipoid disorders: Secondary | ICD-10-CM | POA: Diagnosis not present

## 2023-06-17 DIAGNOSIS — Z125 Encounter for screening for malignant neoplasm of prostate: Secondary | ICD-10-CM | POA: Diagnosis not present

## 2023-06-25 DIAGNOSIS — Z125 Encounter for screening for malignant neoplasm of prostate: Secondary | ICD-10-CM | POA: Diagnosis not present

## 2023-06-25 DIAGNOSIS — Z0000 Encounter for general adult medical examination without abnormal findings: Secondary | ICD-10-CM | POA: Diagnosis not present

## 2023-06-25 DIAGNOSIS — Z13220 Encounter for screening for lipoid disorders: Secondary | ICD-10-CM | POA: Diagnosis not present

## 2023-06-25 DIAGNOSIS — D649 Anemia, unspecified: Secondary | ICD-10-CM | POA: Diagnosis not present

## 2023-07-28 DIAGNOSIS — A048 Other specified bacterial intestinal infections: Secondary | ICD-10-CM | POA: Diagnosis not present

## 2023-07-28 DIAGNOSIS — K219 Gastro-esophageal reflux disease without esophagitis: Secondary | ICD-10-CM | POA: Diagnosis not present

## 2023-07-28 DIAGNOSIS — R935 Abnormal findings on diagnostic imaging of other abdominal regions, including retroperitoneum: Secondary | ICD-10-CM | POA: Diagnosis not present

## 2023-07-28 DIAGNOSIS — R1013 Epigastric pain: Secondary | ICD-10-CM | POA: Diagnosis not present

## 2023-07-28 DIAGNOSIS — Z1211 Encounter for screening for malignant neoplasm of colon: Secondary | ICD-10-CM | POA: Diagnosis not present

## 2023-08-10 DIAGNOSIS — Z1211 Encounter for screening for malignant neoplasm of colon: Secondary | ICD-10-CM | POA: Diagnosis not present

## 2023-08-10 DIAGNOSIS — A048 Other specified bacterial intestinal infections: Secondary | ICD-10-CM | POA: Diagnosis not present

## 2023-08-10 DIAGNOSIS — K3189 Other diseases of stomach and duodenum: Secondary | ICD-10-CM | POA: Diagnosis not present

## 2023-08-10 DIAGNOSIS — K219 Gastro-esophageal reflux disease without esophagitis: Secondary | ICD-10-CM | POA: Diagnosis not present

## 2023-08-10 DIAGNOSIS — K2950 Unspecified chronic gastritis without bleeding: Secondary | ICD-10-CM | POA: Diagnosis not present

## 2023-08-10 DIAGNOSIS — R1013 Epigastric pain: Secondary | ICD-10-CM | POA: Diagnosis not present

## 2023-08-10 DIAGNOSIS — K648 Other hemorrhoids: Secondary | ICD-10-CM | POA: Diagnosis not present

## 2023-08-18 DIAGNOSIS — K760 Fatty (change of) liver, not elsewhere classified: Secondary | ICD-10-CM | POA: Diagnosis not present

## 2023-08-18 DIAGNOSIS — K8020 Calculus of gallbladder without cholecystitis without obstruction: Secondary | ICD-10-CM | POA: Diagnosis not present

## 2023-08-18 DIAGNOSIS — K5900 Constipation, unspecified: Secondary | ICD-10-CM | POA: Diagnosis not present

## 2023-08-18 DIAGNOSIS — R935 Abnormal findings on diagnostic imaging of other abdominal regions, including retroperitoneum: Secondary | ICD-10-CM | POA: Diagnosis not present

## 2023-10-22 DIAGNOSIS — E538 Deficiency of other specified B group vitamins: Secondary | ICD-10-CM | POA: Diagnosis not present

## 2023-10-22 DIAGNOSIS — D519 Vitamin B12 deficiency anemia, unspecified: Secondary | ICD-10-CM | POA: Diagnosis not present

## 2023-10-22 DIAGNOSIS — R0683 Snoring: Secondary | ICD-10-CM | POA: Diagnosis not present

## 2023-10-22 DIAGNOSIS — Z0000 Encounter for general adult medical examination without abnormal findings: Secondary | ICD-10-CM | POA: Diagnosis not present

## 2023-10-22 DIAGNOSIS — G478 Other sleep disorders: Secondary | ICD-10-CM | POA: Diagnosis not present

## 2023-10-22 DIAGNOSIS — A048 Other specified bacterial intestinal infections: Secondary | ICD-10-CM | POA: Diagnosis not present

## 2024-01-04 DIAGNOSIS — R0683 Snoring: Secondary | ICD-10-CM | POA: Diagnosis not present

## 2024-01-04 DIAGNOSIS — R0602 Shortness of breath: Secondary | ICD-10-CM | POA: Diagnosis not present

## 2024-01-12 DIAGNOSIS — K219 Gastro-esophageal reflux disease without esophagitis: Secondary | ICD-10-CM | POA: Diagnosis not present

## 2024-01-12 DIAGNOSIS — K760 Fatty (change of) liver, not elsewhere classified: Secondary | ICD-10-CM | POA: Diagnosis not present

## 2024-01-12 DIAGNOSIS — Z8249 Family history of ischemic heart disease and other diseases of the circulatory system: Secondary | ICD-10-CM | POA: Diagnosis not present

## 2024-01-12 DIAGNOSIS — L309 Dermatitis, unspecified: Secondary | ICD-10-CM | POA: Diagnosis not present

## 2024-02-09 DIAGNOSIS — R0609 Other forms of dyspnea: Secondary | ICD-10-CM | POA: Diagnosis not present

## 2024-02-09 DIAGNOSIS — R053 Chronic cough: Secondary | ICD-10-CM | POA: Diagnosis not present

## 2024-02-10 DIAGNOSIS — R9389 Abnormal findings on diagnostic imaging of other specified body structures: Secondary | ICD-10-CM | POA: Diagnosis not present

## 2024-02-10 DIAGNOSIS — J45909 Unspecified asthma, uncomplicated: Secondary | ICD-10-CM | POA: Diagnosis not present
